# Patient Record
Sex: Female | Born: 1965
Health system: Southern US, Community
[De-identification: ages and names within clinical notes are randomized; demographics above are authoritative.]

## PROBLEM LIST (undated history)

## (undated) DIAGNOSIS — Z91199 Patient's noncompliance with other medical treatment and regimen due to unspecified reason: Secondary | ICD-10-CM

## (undated) DIAGNOSIS — F3113 Bipolar disorder, current episode manic without psychotic features, severe: Secondary | ICD-10-CM

## (undated) DIAGNOSIS — I10 Essential (primary) hypertension: Secondary | ICD-10-CM

## (undated) DIAGNOSIS — Z9119 Patient's noncompliance with other medical treatment and regimen: Secondary | ICD-10-CM

## (undated) DIAGNOSIS — E119 Type 2 diabetes mellitus without complications: Secondary | ICD-10-CM

## (undated) DIAGNOSIS — E785 Hyperlipidemia, unspecified: Secondary | ICD-10-CM

## (undated) HISTORY — DX: Type 2 diabetes mellitus without complications: E11.9

## (undated) HISTORY — DX: Patient's noncompliance with other medical treatment and regimen: Z91.19

## (undated) HISTORY — DX: Patient's noncompliance with other medical treatment and regimen due to unspecified reason: Z91.199

## (undated) HISTORY — PX: NO PAST SURGERIES: SHX2092

## (undated) HISTORY — DX: Essential (primary) hypertension: I10

## (undated) HISTORY — DX: Bipolar disorder, current episode manic without psychotic features, severe: F31.13

## (undated) HISTORY — DX: Hyperlipidemia, unspecified: E78.5

---

## 2005-10-13 ENCOUNTER — Ambulatory Visit: Payer: Self-pay | Admitting: Family Medicine

## 2005-10-22 ENCOUNTER — Ambulatory Visit: Payer: Self-pay | Admitting: *Deleted

## 2006-05-07 ENCOUNTER — Emergency Department (HOSPITAL_COMMUNITY): Admission: EM | Admit: 2006-05-07 | Discharge: 2006-05-08 | Payer: Self-pay | Admitting: Emergency Medicine

## 2006-05-12 ENCOUNTER — Ambulatory Visit: Payer: Self-pay | Admitting: Family Medicine

## 2006-05-24 ENCOUNTER — Ambulatory Visit: Payer: Self-pay | Admitting: Family Medicine

## 2006-05-27 ENCOUNTER — Ambulatory Visit (HOSPITAL_COMMUNITY): Admission: RE | Admit: 2006-05-27 | Discharge: 2006-05-27 | Payer: Self-pay | Admitting: Family Medicine

## 2006-06-02 ENCOUNTER — Ambulatory Visit: Payer: Self-pay | Admitting: Family Medicine

## 2006-06-02 LAB — CONVERTED CEMR LAB

## 2007-08-29 ENCOUNTER — Encounter (INDEPENDENT_AMBULATORY_CARE_PROVIDER_SITE_OTHER): Payer: Self-pay | Admitting: Family Medicine

## 2007-08-29 DIAGNOSIS — J309 Allergic rhinitis, unspecified: Secondary | ICD-10-CM | POA: Insufficient documentation

## 2019-02-20 ENCOUNTER — Ambulatory Visit: Payer: Self-pay | Admitting: Nurse Practitioner

## 2020-06-14 ENCOUNTER — Other Ambulatory Visit: Payer: Self-pay

## 2020-06-14 ENCOUNTER — Ambulatory Visit (INDEPENDENT_AMBULATORY_CARE_PROVIDER_SITE_OTHER): Payer: No Payment, Other | Admitting: Psychiatry

## 2020-06-14 ENCOUNTER — Encounter (HOSPITAL_COMMUNITY): Payer: Self-pay | Admitting: Psychiatry

## 2020-06-14 DIAGNOSIS — F319 Bipolar disorder, unspecified: Secondary | ICD-10-CM | POA: Diagnosis not present
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
## 2020-07-11 ENCOUNTER — Ambulatory Visit (HOSPITAL_COMMUNITY): Payer: Self-pay | Admitting: Psychiatry

## 2020-07-23 ENCOUNTER — Ambulatory Visit: Payer: Self-pay | Attending: Nurse Practitioner | Admitting: Nurse Practitioner

## 2020-07-23 ENCOUNTER — Encounter: Payer: Self-pay | Admitting: Nurse Practitioner

## 2020-07-23 VITALS — Ht 76.0 in | Wt 184.0 lb

## 2020-07-23 DIAGNOSIS — E1165 Type 2 diabetes mellitus with hyperglycemia: Secondary | ICD-10-CM

## 2020-07-23 DIAGNOSIS — E118 Type 2 diabetes mellitus with unspecified complications: Secondary | ICD-10-CM

## 2020-07-23 DIAGNOSIS — IMO0002 Reserved for concepts with insufficient information to code with codable children: Secondary | ICD-10-CM

## 2020-07-23 DIAGNOSIS — I10 Essential (primary) hypertension: Secondary | ICD-10-CM

## 2020-07-23 DIAGNOSIS — E785 Hyperlipidemia, unspecified: Secondary | ICD-10-CM

## 2020-07-23 DIAGNOSIS — Z114 Encounter for screening for human immunodeficiency virus [HIV]: Secondary | ICD-10-CM

## 2020-07-23 DIAGNOSIS — Z1159 Encounter for screening for other viral diseases: Secondary | ICD-10-CM

## 2020-07-23 DIAGNOSIS — Z1211 Encounter for screening for malignant neoplasm of colon: Secondary | ICD-10-CM

## 2020-07-23 DIAGNOSIS — Z130 Encounter for screening for diseases of the blood and blood-forming organs and certain disorders involving the immune mechanism: Secondary | ICD-10-CM

## 2020-07-23 NOTE — Progress Notes (Signed)
Virtual Visit via Telephone Note Due to national recommendations of social distancing due to [place] 19, telehealth visit is felt to be most appropriate for this patient at this time.  I discussed the limitations, risks, security and privacy concerns of performing an evaluation and management service by telephone and the availability of in person appointments. I also discussed with the patient that there may be a patient responsible charge related to this service. The patient expressed understanding and agreed to proceed.    I connected with Sherron Flemings on 07/23/20  at   8:30 AM EDT  EDT by telephone and verified that I am speaking with the correct person using two identifiers.   Consent I discussed the limitations, risks, security and privacy concerns of performing an evaluation and management service by telephone and the availability of in person appointments. I also discussed with the patient that there may be a patient responsible charge related to this service. The patient expressed understanding and agreed to proceed.   Location of Patient: Private Residence   Location of Provider: [place] and [employer] Office    Persons participating in Telemedicine visit: Geryl Rankins FNP-BC [place]    History of Present Illness: Telemedicine visit for: Establish care  has a past medical history of Bipolar disorder with severe mania ([place]), Diabetes mellitus without complication ([place]), Hyperlipidemia, Hypertension, and Noncompliance.  States she does not have HTN or DM. I have instructed her that my goal if she sees me will be to treat her chronic health conditions and that may require medications to be prescribed. If she does not want to take medications she would need to seek a holistic practitioner as I can not treat an A1c >15 or malignant HTN without medications.   DM TYPE 2 Last A1c 04-2020 was >15. She was prescribed metformin but is not taking it. Has a history  of non adherence. States she does not have diabetes and uses her mother and father's meter. When I asked her to recall some of her meter readings she states her last blood glucose reading was 120.70. I instructed her to bring her meter with her to her appointment as this is not an actual blood glucose readings by our office standards.   Essential Hypertension Last recorded BP reading 180/115 on 06-10-2020 when she attempted to establish care at another Primary office through [facility]. At that time she refused to start any blood pressure medications and stated she was drinking a special tea to help bring her blood pressure down. Today she states her blood pressure is "fine". Recalls readings at home 180-190/100-110s. I have instructed her that these are not normal blood pressure readings however she states they are. Denies chest pain, shortness of breath, palpitations, lightheadedness, dizziness, headaches or BLE edema.   Seeing a counselor at [facility] behavioral health for her Bipolar 1 disorder. She is noncompliant on Vraylar 3 mg daily, Depakote 500 mg a.m. and Depakote 1000 mg at bedtime.   Past Medical History:  Diagnosis Date  . Bipolar disorder with severe mania ([place])   . Diabetes mellitus without complication ([place])   . Hyperlipidemia   . Hypertension   . Noncompliance     Past Surgical History:  Procedure Laterality Date  . NO PAST SURGERIES      Family History  Problem Relation Age of Onset  . Diabetes Mother   . Hypertension Mother   . Diabetes Father   . Hypertension Father     Social History  Socioeconomic History  . Marital status: Single    Spouse name: Not on file  . Number of children: Not on file  . Years of education: Not on file  . Highest education level: Not on file  Occupational History  . Not on file  Tobacco Use  . Smoking status: Never Smoker  . Smokeless tobacco: Never Used  Substance and Sexual Activity  . Alcohol use: Not Currently  . Drug use: Not Currently   . Sexual activity: Not Currently  Other Topics Concern  . Not on file  Social History Narrative  . Not on file   Social Determinants of Health   Financial Resource Strain:   . Difficulty of Paying Living Expenses: Not on file  Food Insecurity:   . Worried About Charity fundraiser in the Last Year: Not on file  . Ran Out of Food in the Last Year: Not on file  Transportation Needs:   . Lack of Transportation (Medical): Not on file  . Lack of Transportation (Non-Medical): Not on file  Physical Activity:   . Days of Exercise per Week: Not on file  . Minutes of Exercise per Session: Not on file  Stress:   . Feeling of Stress : Not on file  Social Connections:   . Frequency of Communication with Friends and Family: Not on file  . Frequency of Social Gatherings with Friends and Family: Not on file  . Attends Religious Services: Not on file  . Active Member of Clubs or Organizations: Not on file  . Attends Archivist Meetings: Not on file  . Marital Status: Not on file     Observations/Objective: Awake, alert and oriented x 3   Review of Systems  Constitutional: Negative for fever, malaise/fatigue and weight loss.  HENT: Negative.  Negative for nosebleeds.   Eyes: Negative.  Negative for blurred vision, double vision and photophobia.  Respiratory: Negative.  Negative for cough and shortness of breath.   Cardiovascular: Negative.  Negative for chest pain, palpitations and leg swelling.  Gastrointestinal: Negative.  Negative for heartburn, nausea and vomiting.  Musculoskeletal: Negative.  Negative for myalgias.  Neurological: Negative.  Negative for dizziness, focal weakness, seizures and headaches.  Psychiatric/Behavioral: Negative for suicidal ideas.    Assessment and Plan: Daneisha was seen today for new patient (initial visit).  Diagnoses and all orders for this visit:  Diabetes mellitus type 2, uncontrolled, with complications ([place]) -     Hemoglobin A1c;  Future -     Microalbumin / creatinine urine ratio; Future  Dyslipidemia, goal LDL below 70 -     Lipid panel; Future  Essential hypertension -     CMP14+EGFR; Future  Screening for deficiency anemia -     CBC; Future  Colon cancer screening -     Fecal occult blood, imunochemical([facility]); Future  Need for hepatitis C screening test -     Hepatitis C Antibody; Future  Encounter for screening for HIV -     HIV antibody (with reflex); Future     Follow Up Instructions Return in about 6 weeks (around 09/03/2020).     I discussed the assessment and treatment plan with the patient. The patient was provided an opportunity to ask questions and all were answered. The patient agreed with the plan and demonstrated an understanding of the instructions.   The patient was advised to call back or seek an in-person evaluation if the symptoms worsen or if the condition fails to improve as anticipated.  I provided 19 minutes of non-face-to-face time during this encounter including median intraservice time, reviewing previous notes, labs, imaging, medications and explaining diagnosis and management.  Gildardo Pounds, FNP-BC

## 2020-08-05 ENCOUNTER — Ambulatory Visit: Payer: Self-pay | Attending: Nurse Practitioner

## 2020-08-05 ENCOUNTER — Other Ambulatory Visit: Payer: Self-pay

## 2020-08-05 DIAGNOSIS — Z1211 Encounter for screening for malignant neoplasm of colon: Secondary | ICD-10-CM

## 2020-08-05 DIAGNOSIS — IMO0002 Reserved for concepts with insufficient information to code with codable children: Secondary | ICD-10-CM

## 2020-08-05 DIAGNOSIS — Z1159 Encounter for screening for other viral diseases: Secondary | ICD-10-CM

## 2020-08-05 DIAGNOSIS — Z114 Encounter for screening for human immunodeficiency virus [HIV]: Secondary | ICD-10-CM

## 2020-08-05 DIAGNOSIS — Z130 Encounter for screening for diseases of the blood and blood-forming organs and certain disorders involving the immune mechanism: Secondary | ICD-10-CM

## 2020-08-05 DIAGNOSIS — I10 Essential (primary) hypertension: Secondary | ICD-10-CM

## 2020-08-05 DIAGNOSIS — E785 Hyperlipidemia, unspecified: Secondary | ICD-10-CM

## 2020-08-06 ENCOUNTER — Other Ambulatory Visit: Payer: Self-pay | Admitting: Nurse Practitioner

## 2020-08-06 LAB — CMP14+EGFR
ALT: 9 IU/L (ref 0–32)
AST: 13 IU/L (ref 0–40)
Albumin/Globulin Ratio: 1.4 (ref 1.2–2.2)
Albumin: 3.9 g/dL (ref 3.8–4.9)
Alkaline Phosphatase: 83 IU/L (ref 44–121)
BUN/Creatinine Ratio: 15 (ref 9–23)
BUN: 11 mg/dL (ref 6–24)
Bilirubin Total: <0.2 mg/dL (ref 0.0–1.2)
CO2: 21 mmol/L (ref 20–29)
Calcium: 9.1 mg/dL (ref 8.7–10.2)
Chloride: 98 mmol/L (ref 96–106)
Creatinine, Ser: 0.71 mg/dL (ref 0.57–1.00)
GFR calc Af Amer: 112 mL/min/{1.73_m2} (ref >59)
GFR calc non Af Amer: 98 mL/min/{1.73_m2} (ref >59)
Globulin, Total: 2.8 g/dL (ref 1.5–4.5)
Glucose: 497 mg/dL — ABNORMAL HIGH (ref 65–99)
Potassium: 4.3 mmol/L (ref 3.5–5.2)
Sodium: 134 mmol/L (ref 134–144)
Total Protein: 6.7 g/dL (ref 6.0–8.5)

## 2020-08-06 LAB — HEPATITIS C ANTIBODY: Hep C Virus Ab: <0.1 s/co ratio (ref 0.0–0.9)

## 2020-08-06 LAB — MICROALBUMIN / CREATININE URINE RATIO
Creatinine, Urine: 27.9 mg/dL
Microalb/Creat Ratio: 137 mg/g creat — ABNORMAL HIGH (ref 0–29)
Microalbumin, Urine: 38.1 ug/mL

## 2020-08-06 LAB — CBC
Hematocrit: 43.3 % (ref 34.0–46.6)
Hemoglobin: 13 g/dL (ref 11.1–15.9)
MCH: 24.3 pg — ABNORMAL LOW (ref 26.6–33.0)
MCHC: 30 g/dL — ABNORMAL LOW (ref 31.5–35.7)
MCV: 81 fL (ref 79–97)
Platelets: 273 10*3/uL (ref 150–450)
RBC: 5.34 x10E6/uL — ABNORMAL HIGH (ref 3.77–5.28)
RDW: 14.3 % (ref 11.7–15.4)
WBC: 9.8 10*3/uL (ref 3.4–10.8)

## 2020-08-06 LAB — LIPID PANEL
Chol/HDL Ratio: 3.4 ratio (ref 0.0–4.4)
Cholesterol, Total: 233 mg/dL — ABNORMAL HIGH (ref 100–199)
HDL: 68 mg/dL (ref >39)
LDL Chol Calc (NIH): 109 mg/dL — ABNORMAL HIGH (ref 0–99)
Triglycerides: 332 mg/dL — ABNORMAL HIGH (ref 0–149)
VLDL Cholesterol Cal: 56 mg/dL — ABNORMAL HIGH (ref 5–40)

## 2020-08-06 LAB — HIV ANTIBODY (ROUTINE TESTING W REFLEX): HIV Screen 4th Generation wRfx: NONREACTIVE

## 2020-08-06 LAB — HEMOGLOBIN A1C
Est. average glucose Bld gHb Est-mCnc: >398 mg/dL
Hgb A1c MFr Bld: >15.5 % — ABNORMAL HIGH (ref 4.8–5.6)

## 2020-08-06 MED ORDER — METFORMIN HCL 500 MG PO TABS: 500.0000 mg | ORAL_TABLET | Freq: Two times a day (BID) | ORAL | 3 refills | Status: DC

## 2020-08-06 MED ORDER — TRUE METRIX METER W/DEVICE KIT: PACK | 0 refills | Status: DC

## 2020-08-06 MED ORDER — VICTOZA 18 MG/3ML ~~LOC~~ SOPN: PEN_INJECTOR | SUBCUTANEOUS | 3 refills | Status: DC

## 2020-08-06 MED ORDER — ATORVASTATIN CALCIUM 20 MG PO TABS: 20.0000 mg | ORAL_TABLET | Freq: Every day | ORAL | 3 refills | Status: DC

## 2020-08-06 MED ORDER — TRUE METRIX BLOOD GLUCOSE TEST VI STRP: ORAL_STRIP | 12 refills | Status: DC

## 2020-08-06 MED ORDER — TRUEPLUS LANCETS 28G MISC: 3 refills | Status: DC

## 2020-08-06 MED ORDER — BD PEN NEEDLE MINI U/F 31G X 5 MM MISC: 1 refills | Status: DC

## 2020-08-07 MED FILL — ATORVASTATIN CALCIUM 20 MG: 20 | 30 days supply | Qty: 30 | Fill #0

## 2020-08-07 MED FILL — TRUEPLUS PEN NDL 31GX3/16: 31G X 5 MM | 90 days supply | Qty: 100 | Fill #0

## 2020-08-07 MED FILL — !VICTOZA 18MG/3ML INJECT: 18 | 27 days supply | Qty: 9 | Fill #0

## 2020-08-07 MED FILL — METFORMIN HCL 500 MG TABS: 500 | 22 days supply | Qty: 60 | Fill #0

## 2020-08-07 MED FILL — !TRUE METRIX BLOOD GLUCOSE: 30 days supply | Qty: 1 | Fill #0

## 2020-08-07 MED FILL — TRUEplus LANCETS 28G MISC: 34 days supply | Qty: 100 | Fill #0

## 2020-08-07 MED FILL — TRUE METRIX TEST STRIP: 34 days supply | Qty: 100 | Fill #0

## 2020-08-11 LAB — FECAL OCCULT BLOOD, IMMUNOCHEMICAL: Fecal Occult Bld: NEGATIVE

## 2020-08-19 ENCOUNTER — Encounter (HOSPITAL_COMMUNITY): Payer: Self-pay | Admitting: Psychiatry

## 2020-08-19 ENCOUNTER — Other Ambulatory Visit: Payer: Self-pay

## 2020-08-19 ENCOUNTER — Ambulatory Visit (INDEPENDENT_AMBULATORY_CARE_PROVIDER_SITE_OTHER): Payer: No Payment, Other | Admitting: Psychiatry

## 2020-08-19 VITALS — BP 199/96 | HR 96 | Ht 66.0 in | Wt 186.0 lb

## 2020-08-19 DIAGNOSIS — F319 Bipolar disorder, unspecified: Secondary | ICD-10-CM

## 2020-08-19 NOTE — Progress Notes (Signed)
BH MD/PA/NP OP Progress Note  08/19/2020 4:53 PM Rebecca Atkins  MRN:  [id number]  Chief Complaint: "I just like to talk"       Per father "when she was on her medications she talked less"  Chief Complaint    Follow-up      HPI: 54 year old female seen today for follow up psychiatric evaluation.    She has a psychiatric history of bipolar 1 disorder.  She is currently not taken any medications and notes that she is doing well.  On exam patient is pleasant, calm, cooperative, engaged in conversation, and maintains eye contact. She notes that she has been doing well without medications and notes that she would not like to restart any medications. She notes that she tried medications in the past for her family members however now would like to do things her way. She informed Probation officer that she talks a lot which is bothersome to her family. She notes that she talks to her self daily and notes she speaks what she is thinking frequently. She denies VAH/SI/HI or paranoia. Patient was seen with her father who notes that the patient talks too much about her past in which she is thinking. He notes that when she was on her medication she talks less. He denies patient having impulsive behaviors, distractibility, irritability, or racing thoughts, grandiosity, or self injures behaviors. He however notes that she utilizes a lot of water which he notes is bothersome to the family and in the past he notes that she would clean her pills. Patient notes that she enjoys drinking water and notes that she drinks a cup of water every 30 minutes. She notes in the past when she would drop the pill she would wash it off. She denies obsessive behaviors.   Patients vitals were taken toay by nursing staff today. Her initial blood pressure was 219/98. It was taken a second time and the reading was 199/96. Patient notes that she has high cholesterol and hypertension however notes that she does not take medications for these diagnoses. She  informed Probation officer that she has an appointment in a month with her primary care provider. Provider encouraged patient to follow-up with her primary care provider soon to help manage her hypertension. Provider discussed potential effects that could occur with increased blood pressure. She endorsed understanding and agreed.  No medications restarted today. Patient will follow up with PCP to manage her hypertension. She will follow-up with outpatient psychiatry as needed for care. No other concerns noted at this time.  Visit Diagnosis:    ICD-10-CM   1. Bipolar I disorder, in full remission ([place])  F31.9     Past Psychiatric History:  bipolar 1 disorder  Past Medical History:  Past Medical History:  Diagnosis Date  . Bipolar disorder with severe mania ([place])   . Diabetes mellitus without complication ([place])   . Hyperlipidemia   . Hypertension   . Noncompliance     Past Surgical History:  Procedure Laterality Date  . NO PAST SURGERIES      Family Psychiatric History: Denies  Family History:  Family History  Problem Relation Age of Onset  . Diabetes Mother   . Hypertension Mother   . Diabetes Father   . Hypertension Father     Social History:  Social History   Socioeconomic History  . Marital status: Single    Spouse name: Not on file  . Number of children: Not on file  . Years of education: Not on file  .  Highest education level: Not on file  Occupational History  . Not on file  Tobacco Use  . Smoking status: Never Smoker  . Smokeless tobacco: Never Used  Substance and Sexual Activity  . Alcohol use: Not Currently  . Drug use: Not Currently  . Sexual activity: Not Currently  Other Topics Concern  . Not on file  Social History Narrative  . Not on file   Social Determinants of Health   Financial Resource Strain:   . Difficulty of Paying Living Expenses: Not on file  Food Insecurity:   . Worried About Charity fundraiser in the Last Year: Not on file  . Ran Out of  Food in the Last Year: Not on file  Transportation Needs:   . Lack of Transportation (Medical): Not on file  . Lack of Transportation (Non-Medical): Not on file  Physical Activity:   . Days of Exercise per Week: Not on file  . Minutes of Exercise per Session: Not on file  Stress:   . Feeling of Stress : Not on file  Social Connections:   . Frequency of Communication with Friends and Family: Not on file  . Frequency of Social Gatherings with Friends and Family: Not on file  . Attends Religious Services: Not on file  . Active Member of Clubs or Organizations: Not on file  . Attends Archivist Meetings: Not on file  . Marital Status: Not on file    Allergies:  Allergies  Allergen Reactions  . Ibuprofen Rash    Metabolic Disorder Labs: Lab Results  Component Value Date   HGBA1C >15.5 (H) 08/05/2020   No results found for: PROLACTIN Lab Results  Component Value Date   CHOL 233 (H) 08/05/2020   TRIG 332 (H) 08/05/2020   HDL 68 08/05/2020   CHOLHDL 3.4 08/05/2020   LDLCALC 109 (H) 08/05/2020   No results found for: TSH  Therapeutic Level Labs: No results found for: LITHIUM No results found for: VALPROATE No components found for:  CBMZ  Current Medications: Current Outpatient Medications  Medication Sig Dispense Refill  . ASPIRIN 81 PO Take by mouth. (Patient not taking: Reported on 08/19/2020)    . atorvastatin (LIPITOR) 20 MG tablet Take 1 tablet (20 mg total) by mouth daily. (Patient not taking: Reported on 08/19/2020) 90 tablet 3  . Blood Glucose Monitoring Suppl (TRUE METRIX METER) w/Device KIT Use as instructed. Check blood glucose level by fingerstick three times per day. (Patient not taking: Reported on 08/19/2020) 1 kit 0  . CALCIUM PO Take by mouth. (Patient not taking: Reported on 08/19/2020)    . glucose blood (TRUE METRIX BLOOD GLUCOSE TEST) test strip Use as instructed. Check blood glucose level by fingerstick three times per day. (Patient not taking:  Reported on 08/19/2020) 100 each 12  . Insulin Pen Needle (B-D UF III MINI PEN NEEDLES) 31G X 5 MM MISC Use as instructed. Inject into the skin once daily (Patient not taking: Reported on 08/19/2020) 100 each 1  . liraglutide (VICTOZA) 18 MG/3ML SOPN SubQ:  Inject 0.6 mg once daily into the skin. Week 2: increase to 1.2 mg once daily; week 3: increase to 1.8 mg once daily (Patient not taking: Reported on 08/19/2020) 9 mL 3  . metFORMIN (GLUCOPHAGE) 500 MG tablet Take 1 tablet (500 mg total) by mouth 2 (two) times daily with a meal. Increase to 2 tablets twice per day after 2 weeks if able to tolerate. (Patient not taking: Reported on 08/19/2020) 180  tablet 3  . TRUEplus Lancets 28G MISC Use as instructed. Check blood glucose level by fingerstick three times per day. (Patient not taking: Reported on 08/19/2020) 100 each 3   No current facility-administered medications for this visit.     Musculoskeletal: Strength & Muscle Tone: within normal limits Gait & Station: normal Patient leans: N/A  Psychiatric Specialty Exam: Review of Systems  Blood pressure (!) 199/96, pulse 96, height 5' 6"  (1.676 m), weight 186 lb (84.4 kg), SpO2 100 %.Body mass index is 30.02 kg/m.  General Appearance: Well Groomed  Eye Contact:  Good  Speech:  Clear and Coherent and Normal Rate  Volume:  Normal  Mood:  Euthymic  Affect:  Appropriate and Congruent  Thought Process:  Coherent, Goal Directed and Linear  Orientation:  Full (Time, Place, and Person)  Thought Content: WDL and Logical   Suicidal Thoughts:  No  Homicidal Thoughts:  No  Memory:  Immediate;   Good Recent;   Good Remote;   Good  Judgement:  Good  Insight:  Good  Psychomotor Activity:  Normal  Concentration:  Concentration: Good and Attention Span: Good  Recall:  Good  Fund of Knowledge: Good  Language: Good  Akathisia:  No  Handed:  Right  AIMS (if indicated): Not done  Assets:  Communication Skills Desire for Improvement Financial  Resources/Insurance Housing Social Support  ADL's:  Intact  Cognition: WNL  Sleep:  Good   Screenings:   Assessment and Plan: Patient currently is not prescribed psychiatric medications. She notes that she is doing well and will follow up as needed.  Follow up as needed.    Salley Slaughter, NP 08/19/2020, 4:52 PM

## 2020-08-21 ENCOUNTER — Ambulatory Visit: Payer: Self-pay | Attending: Nurse Practitioner

## 2020-08-21 ENCOUNTER — Other Ambulatory Visit: Payer: Self-pay

## 2020-09-02 ENCOUNTER — Other Ambulatory Visit: Payer: Self-pay | Admitting: Nurse Practitioner

## 2020-09-02 ENCOUNTER — Encounter: Payer: Self-pay | Admitting: Nurse Practitioner

## 2020-09-02 ENCOUNTER — Other Ambulatory Visit: Payer: Self-pay

## 2020-09-02 ENCOUNTER — Ambulatory Visit: Payer: Self-pay | Attending: Nurse Practitioner | Admitting: Nurse Practitioner

## 2020-09-02 VITALS — BP 162/95 | HR 74 | Temp 97.7°F | Ht 65.0 in | Wt 185.0 lb

## 2020-09-02 DIAGNOSIS — Z0000 Encounter for general adult medical examination without abnormal findings: Secondary | ICD-10-CM

## 2020-09-02 DIAGNOSIS — E785 Hyperlipidemia, unspecified: Secondary | ICD-10-CM

## 2020-09-02 DIAGNOSIS — IMO0002 Reserved for concepts with insufficient information to code with codable children: Secondary | ICD-10-CM

## 2020-09-02 DIAGNOSIS — E118 Type 2 diabetes mellitus with unspecified complications: Secondary | ICD-10-CM

## 2020-09-02 DIAGNOSIS — Z1211 Encounter for screening for malignant neoplasm of colon: Secondary | ICD-10-CM

## 2020-09-02 DIAGNOSIS — E1165 Type 2 diabetes mellitus with hyperglycemia: Secondary | ICD-10-CM

## 2020-09-02 DIAGNOSIS — I10 Essential (primary) hypertension: Secondary | ICD-10-CM

## 2020-09-02 DIAGNOSIS — Z1231 Encounter for screening mammogram for malignant neoplasm of breast: Secondary | ICD-10-CM

## 2020-09-02 LAB — GLUCOSE, POCT (MANUAL RESULT ENTRY): POC Glucose: 366 mg/dl — AB (ref 70–99)

## 2020-09-02 MED ORDER — TRUE METRIX BLOOD GLUCOSE TEST VI STRP: ORAL_STRIP | 12 refills | Status: DC

## 2020-09-02 MED ORDER — TRUEPLUS LANCETS 28G MISC: 3 refills | Status: DC

## 2020-09-02 MED ORDER — TRUE METRIX METER W/DEVICE KIT: PACK | 0 refills | Status: DC

## 2020-09-02 MED ORDER — METFORMIN HCL 500 MG PO TABS: 500.0000 mg | ORAL_TABLET | Freq: Two times a day (BID) | ORAL | 3 refills | Status: DC

## 2020-09-02 MED ORDER — LOSARTAN POTASSIUM 50 MG PO TABS: 50.0000 mg | ORAL_TABLET | Freq: Every day | ORAL | 3 refills | Status: DC

## 2020-09-02 MED ORDER — VICTOZA 18 MG/3ML ~~LOC~~ SOPN: PEN_INJECTOR | SUBCUTANEOUS | 3 refills | Status: DC

## 2020-09-02 MED ORDER — BD PEN NEEDLE MINI U/F 31G X 5 MM MISC: 1 refills | Status: DC

## 2020-09-02 MED ORDER — ATORVASTATIN CALCIUM 20 MG PO TABS: 20.0000 mg | ORAL_TABLET | Freq: Every day | ORAL | 3 refills | Status: DC

## 2020-09-02 MED FILL — TRUEplus LANCETS 28G MISC: 25 days supply | Qty: 100 | Fill #0

## 2020-09-02 MED FILL — ATORVASTATIN CALCIUM 20 MG: 20 | 30 days supply | Qty: 30 | Fill #0

## 2020-09-02 MED FILL — TRUE METRIX GO GLUCOSE METE: W/DEVICE | 30 days supply | Qty: 1 | Fill #0

## 2020-09-02 MED FILL — TRUEPLUS PEN NDL 31GX3/16: 31G X 5 MM | 25 days supply | Qty: 100 | Fill #0

## 2020-09-02 MED FILL — !VICTOZA 18MG/3ML INJECT: 18 | 17 days supply | Qty: 3 | Fill #0

## 2020-09-02 MED FILL — METFORMIN HCL 500 MG TABS: 500 | 30 days supply | Qty: 120 | Fill #0

## 2020-09-02 MED FILL — TRUE METRIX TEST STRIP: 30 days supply | Qty: 100 | Fill #0

## 2020-09-02 NOTE — Progress Notes (Signed)
Assessment & Plan:  [name] was seen today for annual exam.  Diagnoses and all orders for this visit:  Physical exam  Colon cancer screening -     Cancel: Fecal occult blood, imunochemical([facility])  Diabetes mellitus type 2, uncontrolled, with complications (HCC) -     Glucose (CBG) -     Blood Glucose Monitoring Suppl (TRUE METRIX METER) w/Device KIT; Use as instructed. Check blood glucose level by fingerstick three times per day. (Patient not taking: Reported on 09/02/2020) -     glucose blood (TRUE METRIX BLOOD GLUCOSE TEST) test strip; Use as instructed. Check blood glucose level by fingerstick three times per day. (Patient not taking: Reported on 09/02/2020) -     Insulin Pen Needle (B-D UF III MINI PEN NEEDLES) 31G X 5 MM MISC; Use as instructed. Inject into the skin once daily. NEEDS PASS (Patient not taking: Reported on 09/02/2020) -     liraglutide (VICTOZA) 18 MG/3ML SOPN; SubQ:  Inject 0.6 mg once daily into the skin. Week 2: increase to 1.2 mg once daily; week 3: increase to 1.8 mg once daily NEEDS PASS (Patient not taking: Reported on 09/02/2020) -     metFORMIN (GLUCOPHAGE) 500 MG tablet; Take 1 tablet (500 mg total) by mouth 2 (two) times daily with a meal. Increase to 2 tablets twice per day after 2 weeks if able to tolerate. NEEDS PASS (Patient not taking: Reported on 09/02/2020) -     TRUEplus Lancets 28G MISC; Use as instructed. Check blood glucose level by fingerstick three times per day. (Patient not taking: Reported on 09/02/2020) -     losartan (COZAAR) 50 MG tablet; Take 1 tablet (50 mg total) by mouth daily. Continue blood sugar control as discussed in office today, low carbohydrate diet, and regular physical exercise as tolerated, 150 minutes per week (30 min each day, 5 days per week, or 50 min 3 days per week). Keep blood sugar logs with fasting goal of 90-130 mg/dl, post prandial (after you eat) less than 180.  For Hypoglycemia: BS <60 and Hyperglycemia BS  >400; contact the clinic ASAP. Annual eye exams and foot exams are recommended.   Breast cancer screening by mammogram -     MM 3D SCREEN BREAST BILATERAL; Future  Dyslipidemia, goal LDL below 70 -     atorvastatin (LIPITOR) 20 MG tablet; Take 1 tablet (20 mg total) by mouth daily. NEEDS PASS (Patient not taking: Reported on 09/02/2020) INSTRUCTIONS: Work on a low fat, heart healthy diet and participate in regular aerobic exercise program by working out at least 150 minutes per week; 5 days a week-30 minutes per day. Avoid red meat/beef/steak,  fried foods. junk foods, sodas, sugary drinks, unhealthy snacking, alcohol and smoking.  Drink at least 80 oz of water per day and monitor your carbohydrate intake daily.    Essential hypertension -     losartan (COZAAR) 50 MG tablet; Take 1 tablet (50 mg total) by mouth daily. Continue all antihypertensives as prescribed.  Remember to bring in your blood pressure log with you for your follow up appointment.  DASH/Mediterranean Diets are healthier choices for HTN.      Patient has been counseled on age-appropriate routine health concerns for screening and prevention. These are reviewed and up-to-date. Referrals have been placed accordingly. Immunizations are up-to-date or declined.    Subjective:   Chief Complaint  Patient presents with  . Annual Exam    Pt. is here for a physical.    HPI  Rebecca Atkins 54 y.o. female presents to office today for physical exam. She adamantly declines PAP smear. Will refer to breast clinic today for mammogram scholarship program.    DM TYPE 2 She has denied having diabetes in the past and up until today has not picked up any of her diabetes medications. Today she states the reason she has not picked up her medications is because she can not afford them. She also tells me today that she does not have a previous history of diabetes. Although A1c in June was 13.2. Associated symptoms: Urinary frequency. SHE  DECLINED INSULIN TODAY IN OFFICE BG 366 Lab Results  Component Value Date   HGBA1C >15.5 (H) 08/05/2020   Review of Systems  Constitutional: Negative for fever, malaise/fatigue and weight loss.  HENT: Negative.  Negative for nosebleeds.   Eyes: Negative.  Negative for blurred vision, double vision and photophobia.  Respiratory: Negative.  Negative for cough and shortness of breath.   Cardiovascular: Negative.  Negative for chest pain, palpitations and leg swelling.  Gastrointestinal: Negative.  Negative for heartburn, nausea and vomiting.  Genitourinary: Positive for frequency.  Musculoskeletal: Negative.  Negative for myalgias.  Skin: Negative.   Neurological: Positive for headaches. Negative for dizziness, focal weakness and seizures.  Endo/Heme/Allergies: Negative.   Psychiatric/Behavioral: Negative.  Negative for suicidal ideas.    Past Medical History:  Diagnosis Date  . Bipolar disorder with severe mania ([place])   . Diabetes mellitus without complication ([place])   . Hyperlipidemia   . Hypertension   . Noncompliance     Past Surgical History:  Procedure Laterality Date  . NO PAST SURGERIES      Family History  Problem Relation Age of Onset  . Diabetes Mother   . Hypertension Mother   . Diabetes Father   . Hypertension Father     Social History Reviewed with no changes to be made today.   Outpatient Medications Prior to Visit  Medication Sig Dispense Refill  . ASPIRIN 81 PO Take by mouth.     Marland Kitchen CALCIUM PO Take by mouth.     Marland Kitchen atorvastatin (LIPITOR) 20 MG tablet Take 1 tablet (20 mg total) by mouth daily. (Patient not taking: Reported on 08/19/2020) 90 tablet 3  . Blood Glucose Monitoring Suppl (TRUE METRIX METER) w/Device KIT Use as instructed. Check blood glucose level by fingerstick three times per day. (Patient not taking: Reported on 08/19/2020) 1 kit 0  . glucose blood (TRUE METRIX BLOOD GLUCOSE TEST) test strip Use as instructed. Check blood glucose level by  fingerstick three times per day. (Patient not taking: Reported on 08/19/2020) 100 each 12  . Insulin Pen Needle (B-D UF III MINI PEN NEEDLES) 31G X 5 MM MISC Use as instructed. Inject into the skin once daily (Patient not taking: Reported on 08/19/2020) 100 each 1  . liraglutide (VICTOZA) 18 MG/3ML SOPN SubQ:  Inject 0.6 mg once daily into the skin. Week 2: increase to 1.2 mg once daily; week 3: increase to 1.8 mg once daily (Patient not taking: Reported on 08/19/2020) 9 mL 3  . metFORMIN (GLUCOPHAGE) 500 MG tablet Take 1 tablet (500 mg total) by mouth 2 (two) times daily with a meal. Increase to 2 tablets twice per day after 2 weeks if able to tolerate. (Patient not taking: Reported on 08/19/2020) 180 tablet 3  . TRUEplus Lancets 28G MISC Use as instructed. Check blood glucose level by fingerstick three times per day. (Patient not taking: Reported on 08/19/2020) 100 each  3   No facility-administered medications prior to visit.    Allergies  Allergen Reactions  . Ibuprofen Rash       Objective:    BP (!) 162/95 (BP Location: Left Arm, Patient Position: Sitting, Cuff Size: Normal)   Pulse 74   Temp 97.7 F (36.5 C) (Temporal)   Ht 5' 5"  (1.651 m)   Wt 185 lb (83.9 kg)   LMP 12/04/2019   SpO2 99%   BMI 30.79 kg/m  Wt Readings from Last 3 Encounters:  09/02/20 185 lb (83.9 kg)  07/23/20 184 lb (83.5 kg)    Physical Exam Constitutional:      Appearance: She is well-developed.  HENT:     Head: Normocephalic and atraumatic.     Right Ear: Hearing, tympanic membrane, ear canal and external ear normal.     Left Ear: Hearing, tympanic membrane, ear canal and external ear normal.     Nose: Nose normal.     Mouth/Throat:     Lips: Pink.     Mouth: Mucous membranes are moist.     Pharynx: No oropharyngeal exudate.  Eyes:     General: No scleral icterus.       Right eye: No discharge.     Conjunctiva/sclera: Conjunctivae normal.     Pupils: Pupils are equal, round, and reactive to  light.  Neck:     Thyroid: No thyromegaly.     Trachea: No tracheal deviation.  Cardiovascular:     Rate and Rhythm: Normal rate and regular rhythm.     Heart sounds: Normal heart sounds. No murmur heard.  No friction rub.  Pulmonary:     Effort: Pulmonary effort is normal. No accessory muscle usage or respiratory distress.     Breath sounds: Normal breath sounds. No decreased breath sounds, wheezing, rhonchi or rales.  Chest:     Chest wall: No tenderness.     Breasts: Breasts are symmetrical.        Right: No inverted nipple, mass, nipple discharge, skin change or tenderness.        Left: No inverted nipple, mass, nipple discharge, skin change or tenderness.  Abdominal:     General: Bowel sounds are normal. There is no distension.     Palpations: Abdomen is soft. There is no mass.     Tenderness: There is no abdominal tenderness. There is no guarding or rebound.  Musculoskeletal:        General: No tenderness or deformity. Normal range of motion.     Cervical back: Normal range of motion and neck supple.  Lymphadenopathy:     Cervical: No cervical adenopathy.     Upper Body:     Right upper body: No supraclavicular, axillary, pectoral or epitrochlear adenopathy.     Left upper body: No supraclavicular, axillary, pectoral or epitrochlear adenopathy.  Skin:    General: Skin is warm and dry.     Findings: No erythema.  Neurological:     Mental Status: She is alert and oriented to person, place, and time.     Cranial Nerves: No cranial nerve deficit.     Sensory: Sensation is intact.     Motor: Motor function is intact.     Coordination: Coordination is intact. Coordination normal.     Gait: Gait is intact.     Deep Tendon Reflexes: Reflexes are normal and symmetric.     Reflex Scores:      Patellar reflexes are 2+ on the right side and 2+  on the left side. Psychiatric:        Mood and Affect: Mood is anxious.        Speech: Speech normal.        Behavior: Behavior normal.         Thought Content: Thought content normal.        Judgment: Judgment normal.          Patient has been counseled extensively about nutrition and exercise as well as the importance of adherence with medications and regular follow-up. The patient was given clear instructions to go to ER or return to medical center if symptoms don't improve, worsen or new problems develop. The patient verbalized understanding.   Follow-up: Return in about 4 weeks (around 09/30/2020) for meter check with luke. See me in 3 months.   Gildardo Pounds, FNP-BC [facility] and [place], [place]   09/02/2020, 11:49 AM

## 2020-09-23 ENCOUNTER — Ambulatory Visit: Payer: Self-pay | Admitting: Pharmacist

## 2020-09-24 ENCOUNTER — Other Ambulatory Visit: Payer: Self-pay

## 2020-09-24 ENCOUNTER — Ambulatory Visit: Payer: Self-pay | Attending: Nurse Practitioner | Admitting: Pharmacist

## 2020-09-24 ENCOUNTER — Other Ambulatory Visit: Payer: Self-pay | Admitting: Family Medicine

## 2020-09-24 ENCOUNTER — Encounter: Payer: Self-pay | Admitting: Pharmacist

## 2020-09-24 DIAGNOSIS — E1165 Type 2 diabetes mellitus with hyperglycemia: Secondary | ICD-10-CM

## 2020-09-24 DIAGNOSIS — E118 Type 2 diabetes mellitus with unspecified complications: Secondary | ICD-10-CM

## 2020-09-24 DIAGNOSIS — IMO0002 Reserved for concepts with insufficient information to code with codable children: Secondary | ICD-10-CM

## 2020-09-24 LAB — GLUCOSE, POCT (MANUAL RESULT ENTRY): POC Glucose: 245 mg/dl — AB (ref 70–99)

## 2020-09-24 MED ORDER — TRUEPLUS PEN NEEDLES 32G X 4 MM MISC
2 refills | Status: DC
Start: 2020-09-24 — End: 2021-01-01

## 2020-09-24 MED ORDER — LANTUS SOLOSTAR 100 UNIT/ML ~~LOC~~ SOPN: 10.0000 [IU] | PEN_INJECTOR | Freq: Every day | SUBCUTANEOUS | 2 refills | Status: DC

## 2020-09-24 MED FILL — ?BASAGLAR 100 UNITS/ML KWPE: 100 | 28 days supply | Qty: 3 | Fill #0

## 2020-09-24 MED FILL — TRUEplus 5-BEVEL PEN NEEDLE: 32G X 4 MM | 25 days supply | Qty: 100 | Fill #0

## 2020-09-24 NOTE — Progress Notes (Signed)
S:     No chief complaint on file.  Patient arrives well and in good spirits.  Presents for diabetes evaluation, education, and management. Patient was referred and last seen by Primary Care Provider, Bertram Denver, on 09/02/2020.   At visit with Zelda, patient was not taking diabetes medications. Patient has a history of noncompliance. Last A1c in September was >15.5, representing a daily average >398. Patient historically resistant to starting medications.   Today, patient reports appropriate adherence with both metformin and Victoza. She reports successful titration with both of these medications without side effects. Patient requesting increase in medications for better glycemic control today. She reports some confusion on what benefits she is receiving from her different medications.   Family/Social History: diabetes and HTN (mother and father)  [employer] affordability: Self pay   Medication adherence reported appropriate (historically has been an issue)   Current diabetes medications include: metformin 1g BID, Victoza 1.8 mg daily Current hypertension medications include: losartan 50 mg daily  Current hyperlipidemia medications include: atorvastatin 20 mg daily  Patient denies hypoglycemic events.  Patient reported dietary habits: Eats 3 meals/day  Recently cut out sweets and fatty meats  Drinks tea daily, does add sugar   Trying to cut back on pastas and breads   Eats a lot of yogurt, veggies, raisins  Patient-reported exercise habits: none reported   Patient reports nocturia (nighttime urination). (has improved since starting medications) Patient denies neuropathy (nerve pain). Patient denies visual changes. Patient denies self foot exams.     O:  Physical Exam   ROS   Lab Results  Component Value Date   HGBA1C >15.5 (H) 08/05/2020   There were no vitals filed for this visit.  Lipid Panel     Component Value Date/Time   CHOL  233 (H) 08/05/2020 1445   TRIG 332 (H) 08/05/2020 1445   HDL 68 08/05/2020 1445   CHOLHDL 3.4 08/05/2020 1445   LDLCALC 109 (H) 08/05/2020 1445    Patient presented with glucometer in hand, as well as a details journal with 4-5 readings per day.  Date AM (fasting) Prior to lunch Afternoon Night  11/2 209 - - -  11/1 180 148 164 n/a  10/31 253 193 146 245  10/30 170 261 182 390  10/29 173 259 213 235   POCT BG today in office: 245   Clinical Atherosclerotic Cardiovascular Disease (ASCVD): No  The 10-year ASCVD risk score Denman George DC Jr., et al., 2013) is: 6.4%   Values used to calculate the score:     Age: 54 years     Sex: Female     Is Non-Hispanic African American: No     Diabetic: Yes     Tobacco smoker: No     Systolic Blood Pressure: 162 mmHg     Is BP treated: Yes     HDL Cholesterol: 68 mg/dL     Total Cholesterol: 233 mg/dL    A/P: Diabetes longstanding currently uncontrolled. Patient is now able to verbalize appropriate hypoglycemia management plan. Medication adherence appears appropriate, however, adherence has historically been in issue. Blood sugars have improved since starting metformin and Victoza, however, they remain elevated. Had a discussion with patient today regarding options, including starting insulin. She is amenable to starting this today.  -Started basal insulin glargine (Lantus) 10 units once every night at bedtime. Advised to contact office if blood sugar drops below 70, or if she become symptomatic.  -Continued GLP-1 Victoza 1.8 mg  once daily injection.   -Continued metformin 1000 mg BID. Continue to take with meal.  -Extensively discussed pathophysiology of diabetes, recommended lifestyle interventions, dietary effects on blood sugar control -Counseled on s/sx of and management of hypoglycemia -Next A1C anticipated December.  ASCVD risk - primary prevention in patient with diabetes. Last LDL is not controlled. ASCVD risk score is not >20%  - moderate  intensity statin indicated. Aspirin is not indicated.  -Continued atorvastatin 20 mg.  -Of note, patient was requesting an increase in atorvastatin dose at today's visit. Patient had some confusion in atorvastatin's mechanism and benefits; it appeared she felt atorvastatin was controlling her blood sugars. Educated on this.   Written patient instructions provided.  Total time in face to face counseling 20 minutes.   Follow up Pharmacist Clinic Visit in 2 weeks.     Theodis Sato, PharmD PGY-1 [facility] Pharmacy Resident   09/24/2020 12:29 PM

## 2020-09-27 MED FILL — ?ATORVASTATIN 20 MG TABLET: 20 | 30 days supply | Qty: 30 | Fill #1

## 2020-09-27 MED FILL — TRUE METRIX TEST STRIP: 30 days supply | Qty: 100 | Fill #1

## 2020-10-08 ENCOUNTER — Ambulatory Visit: Payer: Self-pay | Admitting: Pharmacist

## 2020-10-11 MED FILL — !LANTUS SOLOSTAR 100UNITS/M: 100 | 28 days supply | Qty: 3 | Fill #1

## 2020-10-11 MED FILL — METFORMIN HCL 500 MG TABS: 500 | 30 days supply | Qty: 120 | Fill #1

## 2020-10-11 MED FILL — $VICTOZA 2-PAK 18MG/3ML PEN: 18 | 30 days supply | Qty: 9 | Fill #1

## 2020-10-16 ENCOUNTER — Ambulatory Visit: Payer: Self-pay | Attending: Nurse Practitioner | Admitting: Pharmacist

## 2020-10-16 ENCOUNTER — Other Ambulatory Visit: Payer: Self-pay | Admitting: Family Medicine

## 2020-10-16 ENCOUNTER — Encounter: Payer: Self-pay | Admitting: Pharmacist

## 2020-10-16 ENCOUNTER — Other Ambulatory Visit: Payer: Self-pay

## 2020-10-16 DIAGNOSIS — E118 Type 2 diabetes mellitus with unspecified complications: Secondary | ICD-10-CM

## 2020-10-16 DIAGNOSIS — E1165 Type 2 diabetes mellitus with hyperglycemia: Secondary | ICD-10-CM

## 2020-10-16 DIAGNOSIS — IMO0002 Reserved for concepts with insufficient information to code with codable children: Secondary | ICD-10-CM | POA: Insufficient documentation

## 2020-10-16 DIAGNOSIS — E785 Hyperlipidemia, unspecified: Secondary | ICD-10-CM

## 2020-10-16 LAB — GLUCOSE, POCT (MANUAL RESULT ENTRY): POC Glucose: 161 mg/dl — AB (ref 70–99)

## 2020-10-16 MED ORDER — ATORVASTATIN CALCIUM 40 MG PO TABS: 40.0000 mg | ORAL_TABLET | Freq: Every day | ORAL | 3 refills | Status: DC

## 2020-10-16 MED FILL — ?ATORVASTATIN 40MG TABLET: 40 | 30 days supply | Qty: 30 | Fill #0

## 2020-10-16 NOTE — Progress Notes (Signed)
S:     PCP: Bertram Denver, NP  Patient arrives in good spirits. Presents for diabetes evaluation, education, and management Patient was referred and last seen by Primary Care Provider on 09/02/2020; and last seen by clinical pharmacy on 09/24/20 at which Lantus 10 units daily was initiated.   Today, patient reports medication adherence and no side-effects with Lantus 10 units daily, Victoza 1.8 mg daily, and metformin twice daily. Reports checking sugars 3x/day and sugars overall within target goal range (see below). Reports nocturia has significantly improved. Additionally, reports taking 2 tablets of atorvastatin 20 mg daily instead of 1 tablet daily as prescribed.   Family/Social History: diabetes and HTN (mother and father)  Insurance coverage/medication affordability: self pay  Medication adherence reported good.   Current diabetes medications include: Lantus 10 units daily, metformin 1g BID, Victoza 1.8 mg daily Current hypertension medications include: losartan 50 mg daily (not taking) Current hyperlipidemia medications include: atorvastatin 20 mg daily (taking 2 tablets)  Patient denies hypoglycemic events.   Patient reported dietary habits: Eats 3 meals/day  Recently cut out sweets and fatty meats  Drinks tea daily, does add sugar   Trying to cut back on pastas and breads   Eats a lot of yogurt, veggies, raisins  Patient-reported exercise habits: none reported   Patient denies nocturia (nighttime urination).  Patient denies neuropathy (nerve pain). Patient denies visual changes. Patient denies self foot exams.     O:  POCT: 161 (post-prandial)  Home fasting blood sugars: 144, 165, 98, 91, 122, 125 2 hour post-meal/random blood sugars: 151, 122, 130, 135, 140 Night: 118, 83, 100, 159, 158, 108   Lab Results  Component Value Date   HGBA1C >15.5 (H) 08/05/2020   There were no vitals filed for this visit.  Lipid Panel     Component Value Date/Time    CHOL 233 (H) 08/05/2020 1445   TRIG 332 (H) 08/05/2020 1445   HDL 68 08/05/2020 1445   CHOLHDL 3.4 08/05/2020 1445   LDLCALC 109 (H) 08/05/2020 1445    Clinical Atherosclerotic Cardiovascular Disease (ASCVD): No  The 10-year ASCVD risk score Denman George DC Jr., et al., 2013) is: 6.4%   Values used to calculate the score:     Age: 40 years     Sex: Female     Is Non-Hispanic African American: No     Diabetic: Yes     Tobacco smoker: No     Systolic Blood Pressure: 162 mmHg     Is BP treated: Yes     HDL Cholesterol: 68 mg/dL     Total Cholesterol: 233 mg/dL    A/P: Diabetes longstanding currently uncontrolled, however home sugars and symptoms have significantly improved since adding Lantus. Medication adherence appears optimal. Patient is able to verbalize appropriate hypoglycemia management plan.  -Continued Lantus 10 units daily -Continued metformin 1g BID -Continued Victoza 1.8 mg daily -Extensively discussed pathophysiology of diabetes, recommended lifestyle interventions, dietary effects on blood sugar control -Counseled on s/sx of and management of hypoglycemia -Next A1C anticipated December 2021.   ASCVD risk - primary prevention in patient with diabetes. Last LDL is not controlled. ASCVD risk score is not >20%  - moderate intensity statin indicated. Patient increased atorvastatin to 40 mg daily due to fear of elevated cholesterol. Aspirin is not indicated.  -Increased atorvastatin 40 mg daily.   Written patient instructions provided.  Total time in face to face counseling 25 minutes.   Follow up Pharmacist Clinic Visit in 3 weeks  Lorel Monaco, PharmD, [place] PGY2 Ambulatory Care Resident [place]

## 2020-11-04 NOTE — Progress Notes (Signed)
    S:     PCP: Bertram Denver, NP  Patient arrives in good spirits. Presents for diabetes evaluation, education, and management. Patient was referred and last seen by Primary Care Provideron 09/02/2020. She was last seen by clinical pharmacy on 10/16/20 at which time home sugars were controlled and no medication changes were made to DM regimen.  Today, patient reports medication adherence with Lantus 10 units daily, metformin twice daily, and Victoza daily. Reports running out of Victoza yesterday and only able to administer 1.2 mg yesterday only. Home post-prandial sugars range between 105-170. Denies sugars <70.   Family/Social History:diabetes and HTN (mother and father)  Insurance coverage/medication affordability: self pay  Medication adherence reported good.   Current diabetes medications include:Lantus 10 units daily, metformin 1g BID, Victoza 1.8 mg daily Current hypertension medications include:losartan 50 mg daily Current hyperlipidemia medications include:atorvastatin 40 mg daily   Patient denies hypoglycemic events.  Patient reported dietary habits: Eats30meals/day  Recently cut out sweets and fatty meats  Drinks tea daily, does add sugar   Trying to cut back on pastas and breads   Eats a lot of yogurt, veggies, raisins  Patient-reported exercise habits: none reported   Patient denies nocturia (nighttime urination).  Patient denies neuropathy (nerve pain). Patient denies visual changes. Patient reports self foot exams.    O:  12/15 from 12/6 2 hour post-meal/random blood sugars: 119, 122, 170, 134, 153, 127, 114, 123, 99, 105, 110, 86, 128, 112, 106  Lab Results  Component Value Date   HGBA1C 9.0 (A) 11/06/2020   There were no vitals filed for this visit.  Lipid Panel     Component Value Date/Time   CHOL 233 (H) 08/05/2020 1445   TRIG 332 (H) 08/05/2020 1445   HDL 68 08/05/2020 1445   CHOLHDL 3.4 08/05/2020 1445   LDLCALC 109 (H) 08/05/2020  1445    Clinical Atherosclerotic Cardiovascular Disease (ASCVD): No  The 10-year ASCVD risk score Denman George DC Jr., et al., 2013) is: 6.4%   Values used to calculate the score:     Age: 54 years     Sex: Female     Is Non-Hispanic African American: No     Diabetic: Yes     Tobacco smoker: No     Systolic Blood Pressure: 162 mmHg     Is BP treated: Yes     HDL Cholesterol: 68 mg/dL     Total Cholesterol: 233 mg/dL    A/P: Diabetes longstanding currently uncontrolled, however, A1c today is 9% (down from >15.5%). Home sugars are within target range. Denies hypoglycemia. Medication adherence appears optimal. Congratulated patient on this achievement and encouraged patient to continue taking medication as prescribed. Patient verbalized understanding. Patient is able to verbalize appropriate hypoglycemia management plan.  -Continued Lantus 10 units daily -Continued metformin 1g BID -Continued Victoza 1.8 mg daily -Checked A1c Today -Extensively discussed pathophysiology of diabetes, recommended lifestyle interventions, dietary effects on blood sugar control -Counseled on s/sx of and management of hypoglycemia -Next A1C anticipated March 2022.   ASCVD risk - primary prevention in patient with diabetes. Last LDLis notcontrolled. ASCVD risk score is not>20% - moderate-highintensity statin indicated. Aspirin is notindicated.  -Continued atorvastatin 40 mg daily  Written patient instructions provided.  Total time in face to face counseling 25 minutes.   Follow up PCP Clinic Visit on 12/03/20   Fabio Neighbors, PharmD, BCPS PGY2 Ambulatory Care Resident [facility]  Pharmacy

## 2020-11-06 ENCOUNTER — Encounter: Payer: Self-pay | Admitting: Pharmacist

## 2020-11-06 ENCOUNTER — Other Ambulatory Visit: Payer: Self-pay

## 2020-11-06 ENCOUNTER — Ambulatory Visit: Payer: Self-pay | Attending: Nurse Practitioner | Admitting: Pharmacist

## 2020-11-06 ENCOUNTER — Encounter (INDEPENDENT_AMBULATORY_CARE_PROVIDER_SITE_OTHER): Payer: Self-pay

## 2020-11-06 DIAGNOSIS — E1165 Type 2 diabetes mellitus with hyperglycemia: Secondary | ICD-10-CM

## 2020-11-06 DIAGNOSIS — IMO0002 Reserved for concepts with insufficient information to code with codable children: Secondary | ICD-10-CM

## 2020-11-06 DIAGNOSIS — E118 Type 2 diabetes mellitus with unspecified complications: Secondary | ICD-10-CM

## 2020-11-06 LAB — POCT GLYCOSYLATED HEMOGLOBIN (HGB A1C): Hemoglobin A1C: 9 % — AB (ref 4.0–5.6)

## 2020-11-06 MED ORDER — VICTOZA 18 MG/3ML ~~LOC~~ SOPN: PEN_INJECTOR | SUBCUTANEOUS | 11 refills | Status: DC

## 2020-11-06 MED FILL — METFORMIN HCL 500 MG TABS: 500 | 30 days supply | Qty: 120 | Fill #2

## 2020-11-06 MED FILL — $LANTUS SOLOSTAR 100 UNITS/: 100 | 28 days supply | Qty: 3 | Fill #2

## 2020-11-06 MED FILL — $VICTOZA 2-PAK 18MG/3ML PEN: 18 | 30 days supply | Qty: 9 | Fill #2

## 2020-11-06 MED FILL — TRUE METRIX TEST STRIP: 30 days supply | Qty: 100 | Fill #2

## 2020-12-03 ENCOUNTER — Ambulatory Visit: Payer: Self-pay | Admitting: Nurse Practitioner

## 2020-12-03 ENCOUNTER — Ambulatory Visit: Payer: Self-pay | Attending: Nurse Practitioner | Admitting: Nurse Practitioner

## 2020-12-03 ENCOUNTER — Other Ambulatory Visit: Payer: Self-pay

## 2020-12-06 ENCOUNTER — Other Ambulatory Visit: Payer: Self-pay | Admitting: Family Medicine

## 2020-12-06 ENCOUNTER — Encounter (INDEPENDENT_AMBULATORY_CARE_PROVIDER_SITE_OTHER): Payer: Self-pay

## 2020-12-06 DIAGNOSIS — IMO0002 Reserved for concepts with insufficient information to code with codable children: Secondary | ICD-10-CM

## 2020-12-06 DIAGNOSIS — E1165 Type 2 diabetes mellitus with hyperglycemia: Secondary | ICD-10-CM

## 2020-12-06 MED FILL — $VICTOZA 2-PAK 18MG/3ML PEN: 18 | 30 days supply | Qty: 9 | Fill #3

## 2020-12-06 MED FILL — METFORMIN HCL 500 MG TABS: 500 | 30 days supply | Qty: 120 | Fill #3

## 2020-12-06 MED FILL — ?ATORVASTATIN 40MG TABLET: 40 | 30 days supply | Qty: 30 | Fill #1

## 2020-12-06 MED FILL — TRUE METRIX GLUCOSE TEST ST: 30 days supply | Qty: 100 | Fill #3

## 2020-12-06 MED FILL — $LANTUS SOLOSTAR 100 UNITS/: 100 | 28 days supply | Qty: 3 | Fill #0

## 2021-01-01 ENCOUNTER — Other Ambulatory Visit: Payer: Self-pay

## 2021-01-01 ENCOUNTER — Encounter: Payer: Self-pay | Admitting: Physician Assistant

## 2021-01-01 ENCOUNTER — Ambulatory Visit: Payer: Self-pay | Attending: Nurse Practitioner | Admitting: Physician Assistant

## 2021-01-01 ENCOUNTER — Other Ambulatory Visit: Payer: Self-pay | Admitting: Physician Assistant

## 2021-01-01 VITALS — BP 186/94 | HR 79 | Resp 16 | Wt 185.6 lb

## 2021-01-01 DIAGNOSIS — I10 Essential (primary) hypertension: Secondary | ICD-10-CM

## 2021-01-01 DIAGNOSIS — E1165 Type 2 diabetes mellitus with hyperglycemia: Secondary | ICD-10-CM

## 2021-01-01 DIAGNOSIS — E785 Hyperlipidemia, unspecified: Secondary | ICD-10-CM

## 2021-01-01 DIAGNOSIS — IMO0002 Reserved for concepts with insufficient information to code with codable children: Secondary | ICD-10-CM

## 2021-01-01 DIAGNOSIS — E118 Type 2 diabetes mellitus with unspecified complications: Secondary | ICD-10-CM

## 2021-01-01 LAB — GLUCOSE, POCT (MANUAL RESULT ENTRY): POC Glucose: 130 mg/dl — AB (ref 70–99)

## 2021-01-01 MED ORDER — ATORVASTATIN CALCIUM 40 MG PO TABS: 40.0000 mg | ORAL_TABLET | Freq: Every day | ORAL | 3 refills | Status: DC

## 2021-01-01 MED ORDER — LANTUS SOLOSTAR 100 UNIT/ML ~~LOC~~ SOPN: 10.0000 [IU] | PEN_INJECTOR | Freq: Every day | SUBCUTANEOUS | 2 refills | Status: DC

## 2021-01-01 MED ORDER — LOSARTAN POTASSIUM 100 MG PO TABS: 100.0000 mg | ORAL_TABLET | Freq: Every day | ORAL | 3 refills | Status: DC

## 2021-01-01 MED ORDER — METFORMIN HCL 500 MG PO TABS: 500.0000 mg | ORAL_TABLET | Freq: Two times a day (BID) | ORAL | 3 refills | Status: DC

## 2021-01-01 MED ORDER — TRUEPLUS PEN NEEDLES 32G X 4 MM MISC
2 refills | Status: DC
Start: 2021-01-01 — End: 2021-01-01

## 2021-01-01 MED ORDER — VICTOZA 18 MG/3ML ~~LOC~~ SOPN: PEN_INJECTOR | SUBCUTANEOUS | 11 refills | Status: DC

## 2021-01-01 MED FILL — METFORMIN HCL 500 MG TABS: 500 | 30 days supply | Qty: 120 | Fill #4

## 2021-01-01 MED FILL — LOSARTAN POTASSIUM 100 MG T: 100 | 33 days supply | Qty: 30 | Fill #0

## 2021-01-01 MED FILL — $VICTOZA 2-PAK 18MG/3ML PEN: 18 | 20 days supply | Qty: 6 | Fill #4

## 2021-01-01 MED FILL — $LANTUS SOLOSTAR 100 UNITS/: 100 | 28 days supply | Qty: 3 | Fill #1

## 2021-01-01 MED FILL — TRUEPLUS PEN NDL 32GX5/32: 32G X 4 MM | 25 days supply | Qty: 100 | Fill #0

## 2021-01-01 MED FILL — ?ATORVASTATIN 40MG TABLET: 40 | 30 days supply | Qty: 30 | Fill #2

## 2021-01-01 NOTE — Progress Notes (Signed)
Rebecca Atkins, is a 55 y.o. female  RXV:[id number]  [id number]:[id number]  DOB - 1966/06/18  Subjective:  Chief Complaint and HPI: Rebecca Atkins is a 55 y.o. female here today for med RF and blood sugar check. 80-120 fasting;  Up to 200 a couple of times but Usu ~150.  Compliant with blood sugar meds.  She is checking her blood sugars tid and has a log with herShe never started taking her blood pressure medications but she is willing to now.  No HA/CP/dizziness.    ROS:   Constitutional:  No f/c, No night sweats, No unexplained weight loss. EENT:  No vision changes, No blurry vision, No hearing changes. No mouth, throat, or ear problems.  Respiratory: No cough, No SOB Cardiac: No CP, no palpitations GI:  No abd pain, No N/V/D. GU: No Urinary s/sx Musculoskeletal: No joint pain Neuro: No headache, no dizziness, no motor weakness.  Skin: No rash Endocrine:  No polydipsia. No polyuria.  Psych: Denies SI/HI  No problems updated.  ALLERGIES: Allergies  Allergen Reactions  . Ibuprofen Rash    PAST MEDICAL HISTORY: Past Medical History:  Diagnosis Date  . Bipolar disorder with severe mania ([place])   . Diabetes mellitus without complication ([place])   . Hyperlipidemia   . Hypertension   . Noncompliance     MEDICATIONS AT HOME: Prior to Admission medications   Medication Sig Start Date End Date Taking? Authorizing Provider  losartan (COZAAR) 100 MG tablet Take 1 tablet (100 mg total) by mouth daily. (take 1/2 daily for the first week, then increase to 1 daily) 01/01/21  Yes McClung, Dionne Bucy, PA-C  ASPIRIN 81 PO Take by mouth.     [provider]  atorvastatin (LIPITOR) 40 MG tablet Take 1 tablet (40 mg total) by mouth daily. NEEDS PASS 01/01/21   Argentina Donovan, PA-C  Blood Glucose Monitoring Suppl (TRUE METRIX METER) w/Device KIT Use as instructed. Check blood glucose level by fingerstick three times per day. 09/02/20   Gildardo Pounds, NP  CALCIUM PO Take by mouth.  Patient  not taking: Reported on 10/16/2020    [provider]  glucose blood (TRUE METRIX BLOOD GLUCOSE TEST) test strip Use as instructed. Check blood glucose level by fingerstick three times per day. 09/02/20   Gildardo Pounds, NP  insulin glargine (LANTUS SOLOSTAR) 100 UNIT/ML Solostar Pen Inject 10 Units into the skin daily. 01/01/21   Argentina Donovan, PA-C  Insulin Pen Needle (TRUEPLUS PEN NEEDLES) 32G X 4 MM MISC Use as instructed to inject Lantus daily. 01/01/21   Argentina Donovan, PA-C  liraglutide (VICTOZA) 18 MG/3ML SOPN SubQ:  Inject 0.6 mg once daily into the skin. Week 2: increase to 1.2 mg once daily; week 3: increase to 1.8 mg once daily NEEDS PASS 01/01/21   Argentina Donovan, PA-C  metFORMIN (GLUCOPHAGE) 500 MG tablet Take 1 tablet (500 mg total) by mouth 2 (two) times daily with a meal. Increase to 2 tablets twice per day after 2 weeks if able to tolerate. NEEDS PASS 01/01/21   Argentina Donovan, PA-C  TRUEplus Lancets 28G MISC Use as instructed. Check blood glucose level by fingerstick three times per day. 09/02/20   Gildardo Pounds, NP     Objective:  EXAM:   Vitals:   01/01/21 1153  BP: (!) 186/94  Pulse: 79  Resp: 16  SpO2: 97%  Weight: 185 lb 9.6 oz (84.2 kg)    General appearance :  A&OX3. NAD. Non-toxic-appearing HEENT: Atraumatic and Normocephalic.  PERRLA. EOM intact.   Chest/Lungs:  Breathing-non-labored, Good air entry bilaterally, breath sounds normal without rales, rhonchi, or wheezing  CVS: S1 S2 regular, no murmurs, gallops, rubs  Extremities: Bilateral Lower Ext shows no edema, both legs are warm to touch with = pulse throughout Neurology:  CN II-XII grossly intact, Non focal.   Psych:  TP linear. J/I WNL. Normal speech. Appropriate eye contact and affect.  Skin:  No Rash  Data Review Lab Results  Component Value Date   HGBA1C 9.0 (A) 11/06/2020   HGBA1C >15.5 (H) 08/05/2020     Assessment & Plan   1. Diabetes mellitus type 2, uncontrolled, with  complications ([place]) improving control-continue same regimen and checking glucoses and follow diabetic diet.   - Glucose (CBG) - Insulin Pen Needle (TRUEPLUS PEN NEEDLES) 32G X 4 MM MISC; Use as instructed to inject Lantus daily.  Dispense: 100 each; Refill: 2 - liraglutide (VICTOZA) 18 MG/3ML SOPN; SubQ:  Inject 0.6 mg once daily into the skin. Week 2: increase to 1.2 mg once daily; week 3: increase to 1.8 mg once daily NEEDS PASS  Dispense: 9 mL; Refill: 11 - metFORMIN (GLUCOPHAGE) 500 MG tablet; Take 1 tablet (500 mg total) by mouth 2 (two) times daily with a meal. Increase to 2 tablets twice per day after 2 weeks if able to tolerate. NEEDS PASS  Dispense: 180 tablet; Refill: 3 - insulin glargine (LANTUS SOLOSTAR) 100 UNIT/ML Solostar Pen; Inject 10 Units into the skin daily.  Dispense: 3 mL; Refill: 2 - CBC with Differential/Platelet  2. Dyslipidemia, goal LDL below 70 - atorvastatin (LIPITOR) 40 MG tablet; Take 1 tablet (40 mg total) by mouth daily. NEEDS PASS  Dispense: 90 tablet; Refill: 3 - Lipid panel  3. Essential hypertension Uncontrolled-but wasn't taking meds - Comprehensive metabolic panel - losartan (COZAAR) 100 MG tablet; Take 1 tablet (100 mg total) by mouth daily. (take 1/2 daily for the first week, then increase to 1 daily)  Dispense: 90 tablet; Refill: 3 - CBC with Differential/Platelet     Patient have been counseled extensively about nutrition and exercise  Return in about 2 months (around 03/01/2021) for with PCP;  DM and htn.  The patient was given clear instructions to go to ER or return to medical center if symptoms don't improve, worsen or new problems develop. The patient verbalized understanding. The patient was told to call to get lab results if they haven't heard anything in the next week.     Freeman Caldron, PA-C [facility] [place], [place]   01/01/2021, 12:13 PM

## 2021-01-02 ENCOUNTER — Encounter: Payer: Self-pay | Admitting: *Deleted

## 2021-01-02 LAB — CBC WITH DIFFERENTIAL/PLATELET
Basophils Absolute: 0.1 10*3/uL (ref 0.0–0.2)
Basos: 0 %
EOS (ABSOLUTE): 0.5 10*3/uL — ABNORMAL HIGH (ref 0.0–0.4)
Eos: 4 %
Hematocrit: 37.4 % (ref 34.0–46.6)
Hemoglobin: 12.1 g/dL (ref 11.1–15.9)
Immature Grans (Abs): 0 10*3/uL (ref 0.0–0.1)
Immature Granulocytes: 0 %
Lymphocytes Absolute: 2.9 10*3/uL (ref 0.7–3.1)
Lymphs: 24 %
MCH: 24.1 pg — ABNORMAL LOW (ref 26.6–33.0)
MCHC: 32.4 g/dL (ref 31.5–35.7)
MCV: 75 fL — ABNORMAL LOW (ref 79–97)
Monocytes Absolute: 0.8 10*3/uL (ref 0.1–0.9)
Monocytes: 7 %
Neutrophils Absolute: 7.5 10*3/uL — ABNORMAL HIGH (ref 1.4–7.0)
Neutrophils: 65 %
Platelets: 364 10*3/uL (ref 150–450)
RBC: 5.02 x10E6/uL (ref 3.77–5.28)
RDW: 15.7 % — ABNORMAL HIGH (ref 11.7–15.4)
WBC: 11.7 10*3/uL — ABNORMAL HIGH (ref 3.4–10.8)

## 2021-01-02 LAB — COMPREHENSIVE METABOLIC PANEL
ALT: 8 IU/L (ref 0–32)
AST: 11 IU/L (ref 0–40)
Albumin/Globulin Ratio: 1.2 (ref 1.2–2.2)
Albumin: 4 g/dL (ref 3.8–4.9)
Alkaline Phosphatase: 85 IU/L (ref 44–121)
BUN/Creatinine Ratio: 15 (ref 9–23)
BUN: 9 mg/dL (ref 6–24)
Bilirubin Total: <0.2 mg/dL (ref 0.0–1.2)
CO2: 24 mmol/L (ref 20–29)
Calcium: 9.3 mg/dL (ref 8.7–10.2)
Chloride: 102 mmol/L (ref 96–106)
Creatinine, Ser: 0.61 mg/dL (ref 0.57–1.00)
GFR calc Af Amer: 120 mL/min/{1.73_m2} (ref >59)
GFR calc non Af Amer: 104 mL/min/{1.73_m2} (ref >59)
Globulin, Total: 3.4 g/dL (ref 1.5–4.5)
Glucose: 110 mg/dL — ABNORMAL HIGH (ref 65–99)
Potassium: 4.2 mmol/L (ref 3.5–5.2)
Sodium: 142 mmol/L (ref 134–144)
Total Protein: 7.4 g/dL (ref 6.0–8.5)

## 2021-01-02 LAB — LIPID PANEL
Chol/HDL Ratio: 2.7 ratio (ref 0.0–4.4)
Cholesterol, Total: 175 mg/dL (ref 100–199)
HDL: 66 mg/dL (ref >39)
LDL Chol Calc (NIH): 95 mg/dL (ref 0–99)
Triglycerides: 77 mg/dL (ref 0–149)
VLDL Cholesterol Cal: 14 mg/dL (ref 5–40)

## 2021-02-22 ENCOUNTER — Other Ambulatory Visit: Payer: Self-pay

## 2021-03-11 ENCOUNTER — Other Ambulatory Visit: Payer: Self-pay

## 2021-03-11 ENCOUNTER — Ambulatory Visit: Payer: Self-pay | Attending: Nurse Practitioner | Admitting: Nurse Practitioner

## 2021-03-11 DIAGNOSIS — E118 Type 2 diabetes mellitus with unspecified complications: Secondary | ICD-10-CM

## 2021-03-11 DIAGNOSIS — E785 Hyperlipidemia, unspecified: Secondary | ICD-10-CM

## 2021-03-11 DIAGNOSIS — IMO0002 Reserved for concepts with insufficient information to code with codable children: Secondary | ICD-10-CM

## 2021-03-11 DIAGNOSIS — E1165 Type 2 diabetes mellitus with hyperglycemia: Secondary | ICD-10-CM

## 2021-03-11 DIAGNOSIS — I10 Essential (primary) hypertension: Secondary | ICD-10-CM

## 2021-03-11 MED ORDER — ATORVASTATIN CALCIUM 40 MG PO TABS
ORAL_TABLET | Freq: Every day | ORAL | 3 refills | Status: DC
  Filled 2021-03-11: qty 30, 30d supply, fill #0
  Filled 2021-04-16: qty 30, 30d supply, fill #1
  Filled 2021-05-12: qty 30, 30d supply, fill #2

## 2021-03-11 MED ORDER — METFORMIN HCL 500 MG PO TABS
1000.0000 mg | ORAL_TABLET | Freq: Two times a day (BID) | ORAL | 3 refills | Status: DC
  Filled 2021-03-11: qty 120, 30d supply, fill #0
  Filled 2021-04-16: qty 120, 30d supply, fill #1
  Filled 2021-05-12: qty 120, 30d supply, fill #2

## 2021-03-11 MED ORDER — INSULIN PEN NEEDLE 32G X 4 MM MISC
2 refills | Status: DC
  Filled 2021-03-11: qty 100, fill #0
  Filled 2021-05-12: qty 100, 25d supply, fill #0

## 2021-03-11 MED ORDER — INSULIN GLARGINE 100 UNIT/ML SOLOSTAR PEN
10.0000 [IU] | PEN_INJECTOR | Freq: Every day | SUBCUTANEOUS | 2 refills | Status: DC
  Filled 2021-03-11: qty 3, 28d supply, fill #0
  Filled 2021-04-16: qty 3, 28d supply, fill #1
  Filled 2021-05-12: qty 3, 28d supply, fill #2

## 2021-03-11 MED ORDER — LISINOPRIL 40 MG PO TABS
40.0000 mg | ORAL_TABLET | Freq: Every day | ORAL | 0 refills | Status: DC
  Filled 2021-03-11: qty 30, 30d supply, fill #0
  Filled 2021-04-16: qty 30, 30d supply, fill #1
  Filled 2021-05-12: qty 30, 30d supply, fill #2

## 2021-03-11 MED ORDER — TRULICITY 0.75 MG/0.5ML ~~LOC~~ SOAJ
0.7500 mg | SUBCUTANEOUS | 3 refills | Status: DC
  Filled 2021-03-11: qty 2, 28d supply, fill #0
  Filled 2021-04-16: qty 2, 28d supply, fill #1
  Filled 2021-05-12: qty 2, 28d supply, fill #2

## 2021-03-11 NOTE — Progress Notes (Signed)
Virtual Visit via Telephone Note Due to national recommendations of social distancing due to COVID 19, telehealth visit is felt to be most appropriate for this patient at this time.  I discussed the limitations, risks, security and privacy concerns of performing an evaluation and management service by telephone and the availability of in person appointments. I also discussed with the patient that there may be a patient responsible charge related to this service. The patient expressed understanding and agreed to proceed.    I connected with Bronson Ing on 03/11/21  at   3:10 PM EDT  EDT by telephone and verified that I am speaking with the correct person using two identifiers.   Consent I discussed the limitations, risks, security and privacy concerns of performing an evaluation and management service by telephone and the availability of in person appointments. I also discussed with the patient that there may be a patient responsible charge related to this service. The patient expressed understanding and agreed to proceed.   Location of Patient: Private Residence   Location of Provider: [facility]    Persons participating in Telemedicine visit: Bertram Denver FNP-BC [facility] [place] CMA Bronson Ing    History of Present Illness: Telemedicine visit for: Follow up She has a past medical history of Bipolar disorder with severe mania Diabetes mellitus without complication, Hyperlipidemia, Hypertension, and Noncompliance.    DM 2 Poorly controlled. History of noncompliance. She states victoza causes "decreased energy" and she would like to discontinue and "Try something else". States Blood glucose this morning: 147. She is currently taking lantus 10units nightly. Will try her on trulicity weekly at this time. LDL not at [place] with atorvastatin 40 mg daily. She will continue to take metformin 1000 mg BID.  Lab Results  Component Value Date   HGBA1C 9.0 (A) 11/06/2020    Lab Results  Component Value Date   LDLCALC 95 01/01/2021    She stopped taking her blood pressure medication in February. Reportedly did not like the way losartan made her feel and it caused her blood glucose levels to increase. Will try lisinopril 40 mg daily. Denies chest pain, shortness of breath, palpitations, lightheadedness, dizziness, headaches or BLE edema.  BP Readings from Last 3 Encounters:  01/01/21 (!) 186/94  09/02/20 (!) 162/95    Past Medical History:  Diagnosis Date  . Bipolar disorder with severe mania (H[facility])   . Diabetes mellitus without complication (H[facility])   . Hyperlipidemia   . Hypertension   . Noncompliance     Past Surgical History:  Procedure Laterality Date  . NO PAST SURGERIES      Family History  Problem Relation Age of Onset  . Diabetes Mother   . Hypertension Mother   . Diabetes Father   . Hypertension Father     Social History   Socioeconomic History  . Marital status: Single    Spouse name: Not on file  . Number of children: Not on file  . Years of education: Not on file  . Highest education level: Not on file  Occupational History  . Not on file  Tobacco Use  . Smoking status: Never Smoker  . Smokeless tobacco: Never Used  Substance and Sexual Activity  . Alcohol use: Not Currently  . Drug use: Not Currently  . Sexual activity: Not Currently  Other Topics Concern  . Not on file  Social History Narrative  . Not on file   Social Determinants of Health   Financial  Resource Strain: Not on file  Food Insecurity: Not on file  Transportation Needs: Not on file  Physical Activity: Not on file  Stress: Not on file  Social Connections: Not on file     Observations/Objective: Awake, alert and oriented x 3   Review of Systems  Constitutional: Negative for fever, malaise/fatigue and weight loss.  HENT: Negative.  Negative for nosebleeds.   Eyes: Negative.  Negative for blurred vision, double vision and photophobia.   Respiratory: Negative.  Negative for cough and shortness of breath.   Cardiovascular: Negative.  Negative for chest pain, palpitations and leg swelling.  Gastrointestinal: Negative.  Negative for heartburn, nausea and vomiting.  Musculoskeletal: Negative.  Negative for myalgias.  Neurological: Negative.  Negative for dizziness, focal weakness, seizures and headaches.  Psychiatric/Behavioral: Negative.  Negative for suicidal ideas.    Assessment and Plan: Diagnoses and all orders for this visit:  Essential hypertension -     lisinopril (ZESTRIL) 40 MG tablet; Take 1 tablet (40 mg total) by mouth daily. Continue all antihypertensives as prescribed.  Remember to bring in your blood pressure log with you for your follow up appointment.  DASH/Mediterranean Diets are healthier choices for HTN.    Diabetes mellitus type 2, uncontrolled, with complications (HCC) -     metFORMIN (GLUCOPHAGE) 500 MG tablet; Take 2 tablets (1,000 mg total) by mouth 2 (two) times daily with a meal. -     insulin glargine (LANTUS) 100 UNIT/ML Solostar Pen; INJECT 10 UNITS INTO THE SKIN DAILY. -     Insulin Pen Needle 32G X 4 MM MISC; USE AS INSTRUCTED TO INJECT LANTUS DAILY. -     Dulaglutide (TRULICITY) 0.75 MG/0.5ML SOPN; Inject 0.75 mg into the skin once a week. Continue blood sugar control as discussed in office today, low carbohydrate diet, and regular physical exercise as tolerated, 150 minutes per week (30 min each day, 5 days per week, or 50 min 3 days per week). Keep blood sugar logs with fasting goal of 90-130 mg/dl, post prandial (after you eat) less than 180.  For Hypoglycemia: BS <60 and Hyperglycemia BS >400; contact the clinic ASAP. Annual eye exams and foot exams are recommended.   Dyslipidemia, goal LDL below 70 -     atorvastatin (LIPITOR) 40 MG tablet; TAKE 1 TABLET (40 MG TOTAL) BY MOUTH DAILY. INSTRUCTIONS: Work on a low fat, heart healthy diet and participate in regular aerobic exercise  program by working out at least 150 minutes per week; 5 days a week-30 minutes per day. Avoid red meat/beef/steak,  fried foods. junk foods, sodas, sugary drinks, unhealthy snacking, alcohol and smoking.  Drink at least 80 oz of water per day and monitor your carbohydrate intake daily.     Follow Up Instructions Return in about 3 months (around 06/10/2021).     I discussed the assessment and treatment plan with the patient. The patient was provided an opportunity to ask questions and all were answered. The patient agreed with the plan and demonstrated an understanding of the instructions.   The patient was advised to call back or seek an in-person evaluation if the symptoms worsen or if the condition fails to improve as anticipated.  I provided 20 minutes of non-face-to-face time during this encounter including median intraservice time, reviewing previous notes, labs, imaging, medications and explaining diagnosis and management.  Claiborne Rigg, FNP-BC

## 2021-03-12 ENCOUNTER — Other Ambulatory Visit: Payer: Self-pay

## 2021-03-13 ENCOUNTER — Encounter: Payer: Self-pay | Admitting: Nurse Practitioner

## 2021-03-13 ENCOUNTER — Other Ambulatory Visit: Payer: Self-pay

## 2021-03-27 ENCOUNTER — Ambulatory Visit: Payer: Self-pay | Attending: Family Medicine

## 2021-03-27 ENCOUNTER — Other Ambulatory Visit: Payer: Self-pay

## 2021-04-08 ENCOUNTER — Telehealth: Payer: Self-pay | Admitting: Nurse Practitioner

## 2021-04-08 NOTE — Telephone Encounter (Signed)
Pt was sent a letter from financial dept. Inform them, that the application they submitted was incomplete, since they were missing some documentation at the time of the appointment, Pt need to reschedule and resubmit all new papers and application for CAFA and OC, P.S. old documents has been sent back by mail to the Pt and Pt. need to make a new appt. 

## 2021-04-09 ENCOUNTER — Ambulatory Visit: Payer: Self-pay | Admitting: Nurse Practitioner

## 2021-04-16 ENCOUNTER — Other Ambulatory Visit: Payer: Self-pay

## 2021-04-16 MED FILL — Glucose Blood Test Strip: 25 days supply | Qty: 100 | Fill #0 | Status: AC

## 2021-04-25 ENCOUNTER — Other Ambulatory Visit: Payer: Self-pay

## 2021-04-28 ENCOUNTER — Other Ambulatory Visit: Payer: Self-pay

## 2021-04-28 ENCOUNTER — Ambulatory Visit: Payer: Self-pay | Attending: Nurse Practitioner

## 2021-05-12 ENCOUNTER — Other Ambulatory Visit: Payer: Self-pay

## 2021-05-12 MED FILL — Glucose Blood Test Strip: 25 days supply | Qty: 100 | Fill #1 | Status: AC

## 2021-06-03 ENCOUNTER — Other Ambulatory Visit: Payer: Self-pay

## 2021-06-03 ENCOUNTER — Ambulatory Visit: Payer: Self-pay | Attending: Nurse Practitioner | Admitting: Nurse Practitioner

## 2021-06-03 ENCOUNTER — Encounter: Payer: Self-pay | Admitting: Nurse Practitioner

## 2021-06-03 VITALS — BP 174/97 | HR 83 | Wt 194.8 lb

## 2021-06-03 DIAGNOSIS — E785 Hyperlipidemia, unspecified: Secondary | ICD-10-CM

## 2021-06-03 DIAGNOSIS — Z1231 Encounter for screening mammogram for malignant neoplasm of breast: Secondary | ICD-10-CM

## 2021-06-03 DIAGNOSIS — I10 Essential (primary) hypertension: Secondary | ICD-10-CM

## 2021-06-03 DIAGNOSIS — IMO0002 Reserved for concepts with insufficient information to code with codable children: Secondary | ICD-10-CM

## 2021-06-03 DIAGNOSIS — E1165 Type 2 diabetes mellitus with hyperglycemia: Secondary | ICD-10-CM

## 2021-06-03 DIAGNOSIS — D72829 Elevated white blood cell count, unspecified: Secondary | ICD-10-CM

## 2021-06-03 DIAGNOSIS — E118 Type 2 diabetes mellitus with unspecified complications: Secondary | ICD-10-CM

## 2021-06-03 LAB — GLUCOSE, POCT (MANUAL RESULT ENTRY): POC Glucose: 178 mg/dl — AB (ref 70–99)

## 2021-06-03 LAB — POCT GLYCOSYLATED HEMOGLOBIN (HGB A1C): HbA1c, POC (controlled diabetic range): 7.4 % — AB (ref 0.0–7.0)

## 2021-06-03 MED ORDER — TRULICITY 0.75 MG/0.5ML ~~LOC~~ SOAJ
0.7500 mg | SUBCUTANEOUS | 3 refills | Status: DC
Start: 2021-06-03 — End: 2021-09-03
  Filled 2021-06-03 – 2021-06-16 (×2): qty 2, 28d supply, fill #0
  Filled 2021-07-09: qty 2, 28d supply, fill #1
  Filled 2021-08-19: qty 2, 28d supply, fill #2

## 2021-06-03 MED ORDER — ATORVASTATIN CALCIUM 40 MG PO TABS
ORAL_TABLET | Freq: Every day | ORAL | 3 refills | Status: DC
  Filled 2021-06-03: qty 90, fill #0
  Filled 2021-06-16: qty 30, 30d supply, fill #0
  Filled 2021-07-09: qty 30, 30d supply, fill #1
  Filled 2021-08-19: qty 30, 30d supply, fill #2
  Filled 2021-10-24: qty 30, 30d supply, fill #3

## 2021-06-03 MED ORDER — GLUCOSE BLOOD VI STRP
ORAL_STRIP | 12 refills | Status: DC
  Filled 2021-06-03 – 2021-06-16 (×2): qty 100, 25d supply, fill #0
  Filled 2021-07-09: qty 100, 25d supply, fill #1
  Filled 2021-08-19: qty 100, 25d supply, fill #2
  Filled 2021-10-24: qty 100, 25d supply, fill #3

## 2021-06-03 MED ORDER — METFORMIN HCL 500 MG PO TABS
1000.0000 mg | ORAL_TABLET | Freq: Two times a day (BID) | ORAL | 3 refills | Status: DC
  Filled 2021-06-03 – 2021-06-16 (×2): qty 120, 30d supply, fill #0
  Filled 2021-07-09: qty 120, 30d supply, fill #1
  Filled 2021-08-19: qty 120, 30d supply, fill #2
  Filled 2021-10-24: qty 120, 30d supply, fill #3

## 2021-06-03 MED ORDER — INSULIN GLARGINE 100 UNIT/ML SOLOSTAR PEN
10.0000 [IU] | PEN_INJECTOR | Freq: Every day | SUBCUTANEOUS | 2 refills | Status: DC
  Filled 2021-06-03 (×2): qty 3, 30d supply, fill #0
  Filled 2021-07-02: qty 6, 56d supply, fill #1

## 2021-06-03 MED ORDER — TRUEPLUS LANCETS 28G MISC
3 refills | Status: AC
  Filled 2021-06-03 – 2022-02-11 (×2): qty 100, 30d supply, fill #0

## 2021-06-03 MED ORDER — AMLODIPINE BESYLATE 10 MG PO TABS
10.0000 mg | ORAL_TABLET | Freq: Every day | ORAL | 1 refills | Status: DC
  Filled 2021-06-03: qty 30, 30d supply, fill #0
  Filled 2021-07-02: qty 90, 90d supply, fill #1

## 2021-06-03 MED ORDER — LISINOPRIL 40 MG PO TABS
40.0000 mg | ORAL_TABLET | Freq: Every day | ORAL | 0 refills | Status: DC
  Filled 2021-06-03: qty 90, 90d supply, fill #0
  Filled 2021-06-16: qty 30, 30d supply, fill #0
  Filled 2021-07-09 (×2): qty 30, 30d supply, fill #1
  Filled 2021-08-19: qty 30, 30d supply, fill #2

## 2021-06-03 MED ORDER — INSULIN PEN NEEDLE 32G X 4 MM MISC
2 refills | Status: DC
  Filled 2021-06-03: qty 100, fill #0
  Filled 2021-07-02: qty 100, 25d supply, fill #0
  Filled 2021-10-24: qty 100, 25d supply, fill #1

## 2021-06-03 NOTE — Progress Notes (Signed)
Glucose number are going up and down. Discuss Lipitor dosage.

## 2021-06-03 NOTE — Progress Notes (Signed)
Assessment & Plan:  Rebecca Atkins was seen today for diabetes.  Diagnoses and all orders for this visit:  Diabetes mellitus type 2, uncontrolled, with complications ([place]) -     POCT glucose (manual entry) -     POCT glycosylated hemoglobin (Hb A1C) -     Ambulatory referral to Ophthalmology -     Dulaglutide (TRULICITY) 7.25 DG/6.4QI SOPN; Inject 0.75 mg into the skin once a week. -     glucose blood test strip; USE AS INSTRUCTED. CHECK BLOOD GLUCOSE LEVEL BY FINGERSTICK THREE TIMES PER DAY. -     insulin glargine (LANTUS) 100 UNIT/ML Solostar Pen; INJECT 10 UNITS INTO THE SKIN DAILY. -     Insulin Pen Needle 32G X 4 MM MISC; USE AS INSTRUCTED TO INJECT LANTUS DAILY. -     metFORMIN (GLUCOPHAGE) 500 MG tablet; Take 2 tablets (1,000 mg total) by mouth 2 (two) times daily with a meal. -     TRUEplus Lancets 28G MISC; USE AS INSTRUCTED. CHECK BLOOD GLUCOSE LEVEL BY FINGERSTICK THREE TIMES PER DAY. Continue blood sugar control as discussed in office today, low carbohydrate diet, and regular physical exercise as tolerated, 150 minutes per week (30 min each day, 5 days per week, or 50 min 3 days per week). Keep blood sugar logs with fasting goal of 90-130 mg/dl, post prandial (after you eat) less than 180.  For Hypoglycemia: BS <60 and Hyperglycemia BS >400; contact the clinic ASAP. Annual eye exams and foot exams are recommended.   Breast cancer screening by mammogram -     MM 3D SCREEN BREAST BILATERAL; Future  Essential hypertension -     CMP14+EGFR -     amLODipine (NORVASC) 10 MG tablet; Take 1 tablet (10 mg total) by mouth daily. -     lisinopril (ZESTRIL) 40 MG tablet; Take 1 tablet (40 mg total) by mouth daily. Continue all antihypertensives as prescribed.  Remember to bring in your blood pressure log with you for your follow up appointment.  DASH/Mediterranean Diets are healthier choices for HTN.     Leukocytosis, unspecified type -     CBC with Differential  Dyslipidemia, goal LDL  below 70 -     atorvastatin (LIPITOR) 40 MG tablet; TAKE 1 TABLET (40 MG TOTAL) BY MOUTH DAILY. -     Lipid panel INSTRUCTIONS: Work on a low fat, heart healthy diet and participate in regular aerobic exercise program by working out at least 150 minutes per week; 5 days a week-30 minutes per day. Avoid red meat/beef/steak,  fried foods. junk foods, sodas, sugary drinks, unhealthy snacking, alcohol and smoking.  Drink at least 80 oz of water per day and monitor your carbohydrate intake daily.    Patient has been counseled on age-appropriate routine health concerns for screening and prevention. These are reviewed and up-to-date. Referrals have been placed accordingly. Immunizations are up-to-date or declined.    Subjective:   Chief Complaint  Patient presents with   Diabetes   HPI Rebecca Atkins 55 y.o. female presents to office today for follow up to DM and HTN.   She declines pap smear stating she was told by her gynecologist if she was not sexually active she did not require a PAP smear.    DM 2 Diabetes improved on Trulicity 3.47QQ weekly and metformin 1000 mg BID. Meter reading averages: 7 day 131  14 day 140 30 day 149 LDL not at goal however it has improved since last reading. She is taking  atorvastatin 40 mg daily as prescribed.  Lab Results  Component Value Date   HGBA1C 7.4 (A) 06/03/2021    Lab Results  Component Value Date   HGBA1C 9.0 (A) 11/06/2020    Lab Results  Component Value Date   LDLCALC 95 01/01/2021     Essential Hypertension Currently taking lisinopril 40 mg daily. Will need to add amlodipine 10 mg today. Denies chest pain, shortness of breath, palpitations, lightheadedness, dizziness, headaches or BLE edema.   BP Readings from Last 3 Encounters:  06/03/21 (!) 174/97  01/01/21 (!) 186/94  09/02/20 (!) 162/95    Review of Systems  Constitutional:  Positive for malaise/fatigue. Negative for fever and weight loss.  HENT: Negative.  Negative for nosebleeds.    Eyes: Negative.  Negative for blurred vision, double vision and photophobia.  Respiratory: Negative.  Negative for cough and shortness of breath.   Cardiovascular: Negative.  Negative for chest pain, palpitations and leg swelling.  Gastrointestinal: Negative.  Negative for heartburn, nausea and vomiting.  Musculoskeletal: Negative.  Negative for myalgias.  Neurological: Negative.  Negative for dizziness, focal weakness, seizures and headaches.  Psychiatric/Behavioral: Negative.  Negative for suicidal ideas.    Past Medical History:  Diagnosis Date   Bipolar disorder with severe mania ([place])    Diabetes mellitus without complication ([place])    Hyperlipidemia    Hypertension    Noncompliance     Past Surgical History:  Procedure Laterality Date   NO PAST SURGERIES      Family History  Problem Relation Age of Onset   Diabetes Mother    Hypertension Mother    Diabetes Father    Hypertension Father     Social History Reviewed with no changes to be made today.   Outpatient Medications Prior to Visit  Medication Sig Dispense Refill   ASPIRIN 81 PO Take by mouth.      Blood Glucose Monitoring Suppl (TRUE METRIX GO GLUCOSE METER) w/Device KIT USE AS INSTRUCTED. CHECK BLOOD GLUCOSE LEVEL BY FINGERSTICK THREE TIMES PER DAY. 1 kit 0   Blood Glucose Monitoring Suppl (TRUE METRIX METER) w/Device KIT Use as instructed. Check blood glucose level by fingerstick three times per day. 1 kit 0   atorvastatin (LIPITOR) 40 MG tablet TAKE 1 TABLET (40 MG TOTAL) BY MOUTH DAILY. 90 tablet 3   Dulaglutide (TRULICITY) 5.78 IO/9.6EX SOPN Inject 0.75 mg into the skin once a week. 2 mL 3   glucose blood test strip USE AS INSTRUCTED. CHECK BLOOD GLUCOSE LEVEL BY FINGERSTICK THREE TIMES PER DAY. 100 strip 12   insulin glargine (LANTUS) 100 UNIT/ML Solostar Pen INJECT 10 UNITS INTO THE SKIN DAILY. 3 mL 2   Insulin Pen Needle 32G X 4 MM MISC USE AS INSTRUCTED TO INJECT LANTUS DAILY. 100 each 2   lisinopril  (ZESTRIL) 40 MG tablet Take 1 tablet (40 mg total) by mouth daily. 90 tablet 0   metFORMIN (GLUCOPHAGE) 500 MG tablet Take 2 tablets (1,000 mg total) by mouth 2 (two) times daily with a meal. 120 tablet 3   TRUEplus Lancets 28G MISC USE AS INSTRUCTED. CHECK BLOOD GLUCOSE LEVEL BY FINGERSTICK THREE TIMES PER DAY. 100 each 3   No facility-administered medications prior to visit.    Allergies  Allergen Reactions   Ibuprofen Rash       Objective:    BP (!) 174/97   Pulse 83   Wt 194 lb 12.8 oz (88.4 kg)   SpO2 98%   BMI 32.42 kg/m  Wt Readings from Last 3 Encounters:  06/03/21 194 lb 12.8 oz (88.4 kg)  01/01/21 185 lb 9.6 oz (84.2 kg)  09/02/20 185 lb (83.9 kg)    Physical Exam Vitals and nursing note reviewed.  Constitutional:      Appearance: She is well-developed.  HENT:     Head: Normocephalic and atraumatic.  Cardiovascular:     Rate and Rhythm: Normal rate and regular rhythm.     Heart sounds: Normal heart sounds. No murmur heard.   No friction rub. No gallop.  Pulmonary:     Effort: Pulmonary effort is normal. No tachypnea or respiratory distress.     Breath sounds: Normal breath sounds. No decreased breath sounds, wheezing, rhonchi or rales.  Chest:     Chest wall: No tenderness.  Abdominal:     General: Bowel sounds are normal.     Palpations: Abdomen is soft.  Musculoskeletal:        General: Normal range of motion.     Cervical back: Normal range of motion.  Skin:    General: Skin is warm and dry.  Neurological:     Mental Status: She is alert and oriented to person, place, and time.     Coordination: Coordination normal.  Psychiatric:        Behavior: Behavior normal. Behavior is cooperative.        Thought Content: Thought content normal.        Judgment: Judgment normal.         Patient has been counseled extensively about nutrition and exercise as well as the importance of adherence with medications and regular follow-up. The patient was given  clear instructions to go to ER or return to medical center if symptoms don't improve, worsen or new problems develop. The patient verbalized understanding.   Follow-up: Return for 3 weeks luke BP check. See me in 3 months.   Gildardo Pounds, FNP-BC [facility], [place]   06/03/2021, 12:22 PM

## 2021-06-04 LAB — CBC WITH DIFFERENTIAL/PLATELET
Basophils Absolute: 0.1 10*3/uL (ref 0.0–0.2)
Basos: 0 %
EOS (ABSOLUTE): 0.4 10*3/uL (ref 0.0–0.4)
Eos: 4 %
Hematocrit: 38.7 % (ref 34.0–46.6)
Hemoglobin: 11.9 g/dL (ref 11.1–15.9)
Immature Grans (Abs): 0 10*3/uL (ref 0.0–0.1)
Immature Granulocytes: 0 %
Lymphocytes Absolute: 2.3 10*3/uL (ref 0.7–3.1)
Lymphs: 21 %
MCH: 22.5 pg — ABNORMAL LOW (ref 26.6–33.0)
MCHC: 30.7 g/dL — ABNORMAL LOW (ref 31.5–35.7)
MCV: 73 fL — ABNORMAL LOW (ref 79–97)
Monocytes Absolute: 0.6 10*3/uL (ref 0.1–0.9)
Monocytes: 6 %
Neutrophils Absolute: 7.8 10*3/uL — ABNORMAL HIGH (ref 1.4–7.0)
Neutrophils: 69 %
Platelets: 381 10*3/uL (ref 150–450)
RBC: 5.28 x10E6/uL (ref 3.77–5.28)
RDW: 17.8 % — ABNORMAL HIGH (ref 11.7–15.4)
WBC: 11.2 10*3/uL — ABNORMAL HIGH (ref 3.4–10.8)

## 2021-06-04 LAB — CMP14+EGFR
ALT: 10 IU/L (ref 0–32)
AST: 14 IU/L (ref 0–40)
Albumin/Globulin Ratio: 1.5 (ref 1.2–2.2)
Albumin: 4.3 g/dL (ref 3.8–4.9)
Alkaline Phosphatase: 95 IU/L (ref 44–121)
BUN/Creatinine Ratio: 11 (ref 9–23)
BUN: 8 mg/dL (ref 6–24)
Bilirubin Total: 0.3 mg/dL (ref 0.0–1.2)
CO2: 24 mmol/L (ref 20–29)
Calcium: 9.9 mg/dL (ref 8.7–10.2)
Chloride: 99 mmol/L (ref 96–106)
Creatinine, Ser: 0.71 mg/dL (ref 0.57–1.00)
Globulin, Total: 2.9 g/dL (ref 1.5–4.5)
Glucose: 164 mg/dL — ABNORMAL HIGH (ref 65–99)
Potassium: 4.3 mmol/L (ref 3.5–5.2)
Sodium: 138 mmol/L (ref 134–144)
Total Protein: 7.2 g/dL (ref 6.0–8.5)
eGFR: 101 mL/min/{1.73_m2} (ref >59)

## 2021-06-04 LAB — LIPID PANEL
Chol/HDL Ratio: 2.7 ratio (ref 0.0–4.4)
Cholesterol, Total: 175 mg/dL (ref 100–199)
HDL: 66 mg/dL (ref >39)
LDL Chol Calc (NIH): 93 mg/dL (ref 0–99)
Triglycerides: 90 mg/dL (ref 0–149)
VLDL Cholesterol Cal: 16 mg/dL (ref 5–40)

## 2021-06-06 ENCOUNTER — Other Ambulatory Visit: Payer: Self-pay

## 2021-06-16 ENCOUNTER — Other Ambulatory Visit: Payer: Self-pay

## 2021-06-16 ENCOUNTER — Telehealth: Payer: Self-pay | Admitting: Nurse Practitioner

## 2021-06-16 NOTE — Telephone Encounter (Signed)
Patient came in requesting a dental referral because she states she needs fillings. Please f/u  Patient has the OC.

## 2021-06-16 NOTE — Telephone Encounter (Signed)
Pt needs referral to dentist she has OC

## 2021-06-19 ENCOUNTER — Other Ambulatory Visit: Payer: Self-pay | Admitting: Nurse Practitioner

## 2021-06-19 DIAGNOSIS — K089 Disorder of teeth and supporting structures, unspecified: Secondary | ICD-10-CM

## 2021-06-24 ENCOUNTER — Ambulatory Visit: Payer: Self-pay | Attending: Nurse Practitioner | Admitting: Pharmacist

## 2021-06-24 ENCOUNTER — Encounter: Payer: Self-pay | Admitting: Pharmacist

## 2021-06-24 ENCOUNTER — Other Ambulatory Visit: Payer: Self-pay

## 2021-06-24 VITALS — BP 138/87

## 2021-06-24 DIAGNOSIS — I10 Essential (primary) hypertension: Secondary | ICD-10-CM

## 2021-06-24 NOTE — Progress Notes (Signed)
   S:    PCP: Zelda   Patient arrives in good spirits. Presents to the clinic for hypertension evaluation, counseling, and management. Patient was referred and last seen by Primary Care Provider on 06/03/2021. BP was 174/97 mmHg at that visit. Amlodipine was added to her regimen.   Medication adherence reported. Of note, pt forgot her medications yesterday. In addition, she has not taken any medications today.   Current BP Medications include:  amlodipine 10 mg daily, lisinopril 40 mg daily   Dietary habits include: compliant with sodium restriction; drinks herbal tea but not caffeinated  Exercise habits include: walks around the house but does not participate in a formal exercise regimen  Family / Social history:  - Fhx: DM, HTN - Tobacco: never smoker  - Alcohol: denies use  O:  Vitals:   06/24/21 1527  BP: 138/87   Home BP readings:  -Brings her log with her today - SBPs are in the 130s-140s. - DBPs are in the 80s-90s.   Last 3 Office BP readings: BP Readings from Last 3 Encounters:  06/24/21 138/87  06/03/21 (!) 174/97  01/01/21 (!) 186/94   BMET    Component Value Date/Time   NA 138 06/03/2021 1118   K 4.3 06/03/2021 1118   CL 99 06/03/2021 1118   CO2 24 06/03/2021 1118   GLUCOSE 164 (H) 06/03/2021 1118   BUN 8 06/03/2021 1118   CREATININE 0.71 06/03/2021 1118   CALCIUM 9.9 06/03/2021 1118   GFRNONAA 104 01/01/2021 1207   GFRAA 120 01/01/2021 1207   Renal function: CrCl cannot be calculated (Patient's most recent lab result is older than the maximum 21 days allowed.).  Clinical ASCVD: No  The 10-year ASCVD risk score Denman George DC Jr., et al., 2013) is: 3.9%   Values used to calculate the score:     Age: 55 years     Sex: Female     Is Non-Hispanic African American: No     Diabetic: Yes     Tobacco smoker: No     Systolic Blood Pressure: 138 mmHg     Is BP treated: Yes     HDL Cholesterol: 66 mg/dL     Total Cholesterol: 175 mg/dL  A/P: Hypertension  longstanding currently close to goal on current medications. BP Goal = < 130/80 mmHg. Medication adherence reported.  -Continued current regimen.  -Counseled on lifestyle modifications for blood pressure control including reduced dietary sodium, increased exercise, adequate sleep.  Results reviewed and written information provided.   Total time in face-to-face counseling 30 minutes. F/U Clinic Visit in 4-6 weeks.    Butch Penny, PharmD, [place], CPP Clinical Pharmacist [facility] [phone]

## 2021-07-02 ENCOUNTER — Other Ambulatory Visit: Payer: Self-pay

## 2021-07-09 ENCOUNTER — Other Ambulatory Visit: Payer: Self-pay

## 2021-07-17 ENCOUNTER — Other Ambulatory Visit: Payer: Self-pay

## 2021-07-24 ENCOUNTER — Other Ambulatory Visit: Payer: Self-pay | Admitting: Nurse Practitioner

## 2021-07-24 DIAGNOSIS — Z1231 Encounter for screening mammogram for malignant neoplasm of breast: Secondary | ICD-10-CM

## 2021-08-01 ENCOUNTER — Other Ambulatory Visit: Payer: Self-pay | Admitting: Nurse Practitioner

## 2021-08-01 DIAGNOSIS — Z1231 Encounter for screening mammogram for malignant neoplasm of breast: Secondary | ICD-10-CM

## 2021-08-15 ENCOUNTER — Ambulatory Visit: Payer: Self-pay | Admitting: Pharmacist

## 2021-08-19 ENCOUNTER — Other Ambulatory Visit: Payer: Self-pay

## 2021-08-19 ENCOUNTER — Other Ambulatory Visit: Payer: Self-pay | Admitting: Nurse Practitioner

## 2021-08-19 DIAGNOSIS — IMO0002 Reserved for concepts with insufficient information to code with codable children: Secondary | ICD-10-CM

## 2021-08-19 DIAGNOSIS — E1165 Type 2 diabetes mellitus with hyperglycemia: Secondary | ICD-10-CM

## 2021-08-19 MED ORDER — LANTUS SOLOSTAR 100 UNIT/ML ~~LOC~~ SOPN
10.0000 [IU] | PEN_INJECTOR | Freq: Every day | SUBCUTANEOUS | 2 refills | Status: DC
  Filled 2021-08-19 – 2021-10-24 (×2): qty 3, 30d supply, fill #0

## 2021-08-20 ENCOUNTER — Other Ambulatory Visit: Payer: Self-pay

## 2021-09-03 ENCOUNTER — Other Ambulatory Visit: Payer: Self-pay

## 2021-09-03 ENCOUNTER — Encounter: Payer: Self-pay | Admitting: Nurse Practitioner

## 2021-09-03 ENCOUNTER — Ambulatory Visit: Payer: Self-pay | Attending: Nurse Practitioner | Admitting: Nurse Practitioner

## 2021-09-03 VITALS — BP 136/84 | HR 73 | Ht 65.0 in | Wt 198.4 lb

## 2021-09-03 DIAGNOSIS — Z794 Long term (current) use of insulin: Secondary | ICD-10-CM

## 2021-09-03 DIAGNOSIS — E1165 Type 2 diabetes mellitus with hyperglycemia: Secondary | ICD-10-CM

## 2021-09-03 DIAGNOSIS — I10 Essential (primary) hypertension: Secondary | ICD-10-CM

## 2021-09-03 LAB — POCT GLYCOSYLATED HEMOGLOBIN (HGB A1C): Hemoglobin A1C: 8.3 % — AB (ref 4.0–5.6)

## 2021-09-03 LAB — GLUCOSE, POCT (MANUAL RESULT ENTRY): POC Glucose: 263 mg/dl — AB (ref 70–99)

## 2021-09-03 MED ORDER — AMLODIPINE BESYLATE 10 MG PO TABS
10.0000 mg | ORAL_TABLET | Freq: Every day | ORAL | 1 refills | Status: DC
  Filled 2021-09-03 – 2021-10-24 (×2): qty 90, 90d supply, fill #0

## 2021-09-03 MED ORDER — TRULICITY 1.5 MG/0.5ML ~~LOC~~ SOAJ
1.5000 mg | SUBCUTANEOUS | 3 refills | Status: AC
  Filled 2021-09-03: qty 2, 28d supply, fill #0

## 2021-09-03 NOTE — Progress Notes (Signed)
Assessment & Plan:  [name] was seen today for diabetes and hypertension.  Diagnoses and all orders for this visit:  Type 2 diabetes mellitus with hyperglycemia, with long-term current use of insulin (HCC) -     POCT glucose (manual entry) -     POCT glycosylated hemoglobin (Hb A1C) -     Dulaglutide (TRULICITY) 1.5 EK/8.0KL SOPN; Inject 1.5 mg into the skin once a week.  Essential hypertension -     amLODipine (NORVASC) 10 MG tablet; Take 1 tablet (10 mg total) by mouth daily.   Patient has been counseled on age-appropriate routine health concerns for screening and prevention. These are reviewed and up-to-date. Referrals have been placed accordingly. Immunizations are up-to-date or declined.    Subjective:   Chief Complaint  Patient presents with   Diabetes   Hypertension   HPI Rebecca Atkins 55 y.o. female presents to office today for follow up to HTN and DM She has a past medical history of Bipolar disorder with severe mania, Diabetes mellitus without complication, Hyperlipidemia, Hypertension, and Noncompliance.    DM She does not monitor her blood glucose levels daily. Poorly controlled. She is taking metformin 1000 mg BID, lantus 10 units daily and trulicity 4.91 mg weekly. Increasing trulicity today to 1.5 mg weekly. She is not dietary adherent. There is a knowledge deficit noted today on carbs and how they affect diabetes. We did discuss this in great length today. LDL not at goal with atorvastatin 40 mg daily.  Lab Results  Component Value Date   HGBA1C 8.3 (A) 09/03/2021    Lab Results  Component Value Date   HGBA1C 7.4 (A) 06/03/2021   Lab Results  Component Value Date   LDLCALC 93 06/03/2021       HTN Well controlled today. However she notes BP readings at home 150-170/90-100s. Will need to bring her monitor with her to her next visit with me in a few months for correlation. Denies chest pain, shortness of breath, palpitations, lightheadedness, dizziness,  headaches or BLE edema.   BP Readings from Last 3 Encounters:  09/03/21 136/84  06/24/21 138/87  06/03/21 (!) 174/97     Review of Systems  Constitutional:  Negative for fever, malaise/fatigue and weight loss.  HENT: Negative.  Negative for nosebleeds.   Eyes: Negative.  Negative for blurred vision, double vision and photophobia.  Respiratory: Negative.  Negative for cough and shortness of breath.   Cardiovascular: Negative.  Negative for chest pain, palpitations and leg swelling.  Gastrointestinal: Negative.  Negative for heartburn, nausea and vomiting.  Musculoskeletal: Negative.  Negative for myalgias.  Neurological: Negative.  Negative for dizziness, focal weakness, seizures and headaches.  Psychiatric/Behavioral: Negative.  Negative for suicidal ideas.    Past Medical History:  Diagnosis Date   Bipolar disorder with severe mania ([place])    Diabetes mellitus without complication ([place])    Hyperlipidemia    Hypertension    Noncompliance     Past Surgical History:  Procedure Laterality Date   NO PAST SURGERIES      Family History  Problem Relation Age of Onset   Diabetes Mother    Hypertension Mother    Diabetes Father    Hypertension Father     Social History Reviewed with no changes to be made today.   Outpatient Medications Prior to Visit  Medication Sig Dispense Refill   ASPIRIN 81 PO Take by mouth.      atorvastatin (LIPITOR) 40 MG tablet TAKE 1 TABLET (40 MG  TOTAL) BY MOUTH DAILY. 90 tablet 3   Blood Glucose Monitoring Suppl (TRUE METRIX METER) w/Device KIT Use as instructed. Check blood glucose level by fingerstick three times per day. 1 kit 0   glucose blood test strip USE AS INSTRUCTED. CHECK BLOOD GLUCOSE LEVEL BY FINGERSTICK THREE TIMES PER DAY. 100 strip 12   insulin glargine (LANTUS SOLOSTAR) 100 UNIT/ML Solostar Pen INJECT 10 UNITS INTO THE SKIN DAILY. 3 mL 2   Insulin Pen Needle 32G X 4 MM MISC USE AS INSTRUCTED TO INJECT LANTUS DAILY. 100 each 2    lisinopril (ZESTRIL) 40 MG tablet Take 1 tablet (40 mg total) by mouth daily. 90 tablet 0   metFORMIN (GLUCOPHAGE) 500 MG tablet Take 2 tablets (1,000 mg total) by mouth 2 (two) times daily with a meal. 120 tablet 3   TRUEplus Lancets 28G MISC USE AS INSTRUCTED. CHECK BLOOD GLUCOSE LEVEL BY FINGERSTICK THREE TIMES PER DAY. 100 each 3   amLODipine (NORVASC) 10 MG tablet Take 1 tablet (10 mg total) by mouth daily. 90 tablet 1   Dulaglutide (TRULICITY) 9.38 BO/1.7PZ SOPN Inject 0.75 mg into the skin once a week. 2 mL 3   No facility-administered medications prior to visit.    Allergies  Allergen Reactions   Ibuprofen Rash       Objective:    BP 136/84   Pulse 73   Ht _0  (1.651 m)   Wt 198 lb 6 oz (90 kg)   SpO2 99%   BMI 33.01 kg/m  Wt Readings from Last 3 Encounters:  09/03/21 198 lb 6 oz (90 kg)  06/03/21 194 lb 12.8 oz (88.4 kg)  01/01/21 185 lb 9.6 oz (84.2 kg)    Physical Exam Vitals and nursing note reviewed.  Constitutional:      Appearance: She is well-developed.  HENT:     Head: Normocephalic and atraumatic.  Cardiovascular:     Rate and Rhythm: Normal rate and regular rhythm.     Heart sounds: Normal heart sounds. No murmur heard.   No friction rub. No gallop.  Pulmonary:     Effort: Pulmonary effort is normal. No tachypnea or respiratory distress.     Breath sounds: Normal breath sounds. No decreased breath sounds, wheezing, rhonchi or rales.  Chest:     Chest wall: No tenderness.  Abdominal:     General: Bowel sounds are normal.     Palpations: Abdomen is soft.  Musculoskeletal:        General: Normal range of motion.     Cervical back: Normal range of motion.  Skin:    General: Skin is warm and dry.  Neurological:     Mental Status: She is alert and oriented to person, place, and time.     Coordination: Coordination normal.  Psychiatric:        Behavior: Behavior normal. Behavior is cooperative.        Thought Content: Thought content normal.         Judgment: Judgment normal.         Patient has been counseled extensively about nutrition and exercise as well as the importance of adherence with medications and regular follow-up. The patient was given clear instructions to go to ER or return to medical center if symptoms don't improve, worsen or new problems develop. The patient verbalized understanding.   Follow-up: Return in about 4 weeks (around 10/01/2021) for meter check with luke in 4 weeks. See me in 3 months and bring BP machine  to visit.   Gildardo Pounds, FNP-BC [facility], [place]   09/03/2021, 9:55 PM

## 2021-09-04 ENCOUNTER — Ambulatory Visit
Admission: RE | Admit: 2021-09-04 | Discharge: 2021-09-04 | Disposition: A | Discharge status: Home / Self Care | Payer: No Typology Code available for payment source | Source: Ambulatory Visit | Attending: Nurse Practitioner | Admitting: Nurse Practitioner

## 2021-09-04 ENCOUNTER — Other Ambulatory Visit: Payer: Self-pay

## 2021-09-04 DIAGNOSIS — Z1231 Encounter for screening mammogram for malignant neoplasm of breast: Secondary | ICD-10-CM

## 2021-09-10 ENCOUNTER — Other Ambulatory Visit: Payer: Self-pay | Admitting: Nurse Practitioner

## 2021-09-10 ENCOUNTER — Other Ambulatory Visit: Payer: Self-pay

## 2021-09-10 DIAGNOSIS — R928 Other abnormal and inconclusive findings on diagnostic imaging of breast: Secondary | ICD-10-CM

## 2021-09-11 ENCOUNTER — Telehealth: Payer: Self-pay | Admitting: Nurse Practitioner

## 2021-09-11 ENCOUNTER — Other Ambulatory Visit: Payer: Self-pay | Admitting: Nurse Practitioner

## 2021-09-11 ENCOUNTER — Other Ambulatory Visit: Payer: Self-pay | Admitting: Obstetrics and Gynecology

## 2021-09-11 DIAGNOSIS — R928 Other abnormal and inconclusive findings on diagnostic imaging of breast: Secondary | ICD-10-CM

## 2021-09-11 NOTE — Telephone Encounter (Signed)
Copied from CRM [id number]. Topic: General - Call Back - No Documentation >> Sep 11, 2021  3:24 PM Crist Infante wrote: Reason for CRM: pt would like Zelda to call her back to discuss results of abnormal mammogram.

## 2021-09-17 NOTE — Telephone Encounter (Signed)
Patient declines further work up for abnormal mammogram. I have instructed her to let the breast center know this when they call her to schedule

## 2021-10-01 ENCOUNTER — Other Ambulatory Visit: Payer: Self-pay

## 2021-10-24 ENCOUNTER — Other Ambulatory Visit: Payer: Self-pay

## 2021-10-24 ENCOUNTER — Other Ambulatory Visit: Payer: Self-pay | Admitting: Nurse Practitioner

## 2021-10-24 ENCOUNTER — Ambulatory Visit: Payer: Self-pay | Admitting: Nurse Practitioner

## 2021-10-24 DIAGNOSIS — I10 Essential (primary) hypertension: Secondary | ICD-10-CM

## 2021-10-24 MED ORDER — LISINOPRIL 40 MG PO TABS
40.0000 mg | ORAL_TABLET | Freq: Every day | ORAL | 2 refills | Status: DC
  Filled 2021-10-24: qty 90, 90d supply, fill #0

## 2021-10-27 ENCOUNTER — Other Ambulatory Visit: Payer: Self-pay

## 2021-11-03 ENCOUNTER — Other Ambulatory Visit: Payer: Self-pay

## 2021-11-06 ENCOUNTER — Ambulatory Visit: Payer: No Typology Code available for payment source

## 2021-11-06 ENCOUNTER — Telehealth: Payer: Self-pay | Admitting: Nurse Practitioner

## 2021-11-06 NOTE — Telephone Encounter (Signed)
Copied from CRM [id number]. Topic: General - Other >> Nov 05, 2021  3:20 PM Glean Salen wrote: Reason for JKK:[id number] needs to reschedule appt with Mikle Bosworth. Please call back

## 2021-11-07 ENCOUNTER — Other Ambulatory Visit: Payer: Self-pay

## 2021-11-10 NOTE — Telephone Encounter (Signed)
I return Pt call, schedule for 11/26/21

## 2021-11-26 ENCOUNTER — Other Ambulatory Visit: Payer: Self-pay

## 2021-11-26 ENCOUNTER — Ambulatory Visit: Payer: No Typology Code available for payment source | Attending: Nurse Practitioner

## 2021-12-08 IMAGING — MG MM DIGITAL SCREENING BILAT W/ TOMO AND CAD
6 of 10 series · 6 of 30 positions shown · non-contrast
Comparison: None.
COMPARISON: None.

Addendum:
CLINICAL DATA: Screening.

EXAM:
DIGITAL SCREENING BILATERAL MAMMOGRAM WITH TOMOSYNTHESIS AND CAD
TECHNIQUE: Bilateral screening digital craniocaudal and mediolateral oblique
mammograms were obtained. Bilateral screening digital breast
tomosynthesis was performed. The images were evaluated with
computer-aided detection.

[L MLO synth-2D]
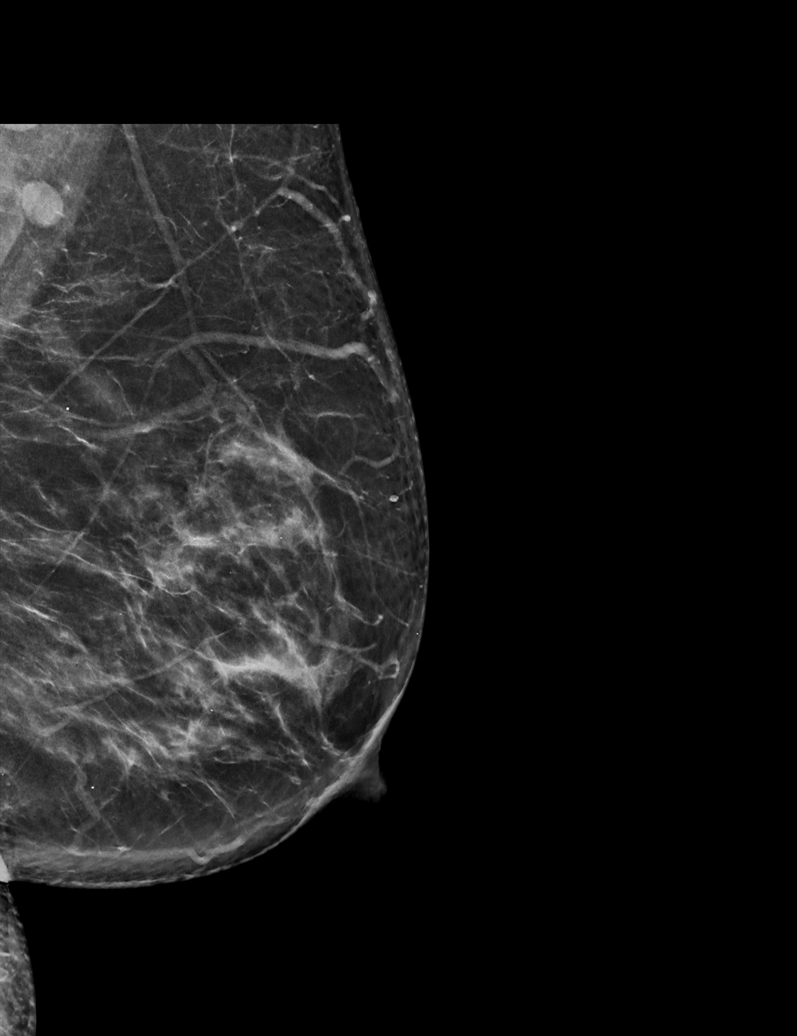

[R CC synth-2D]
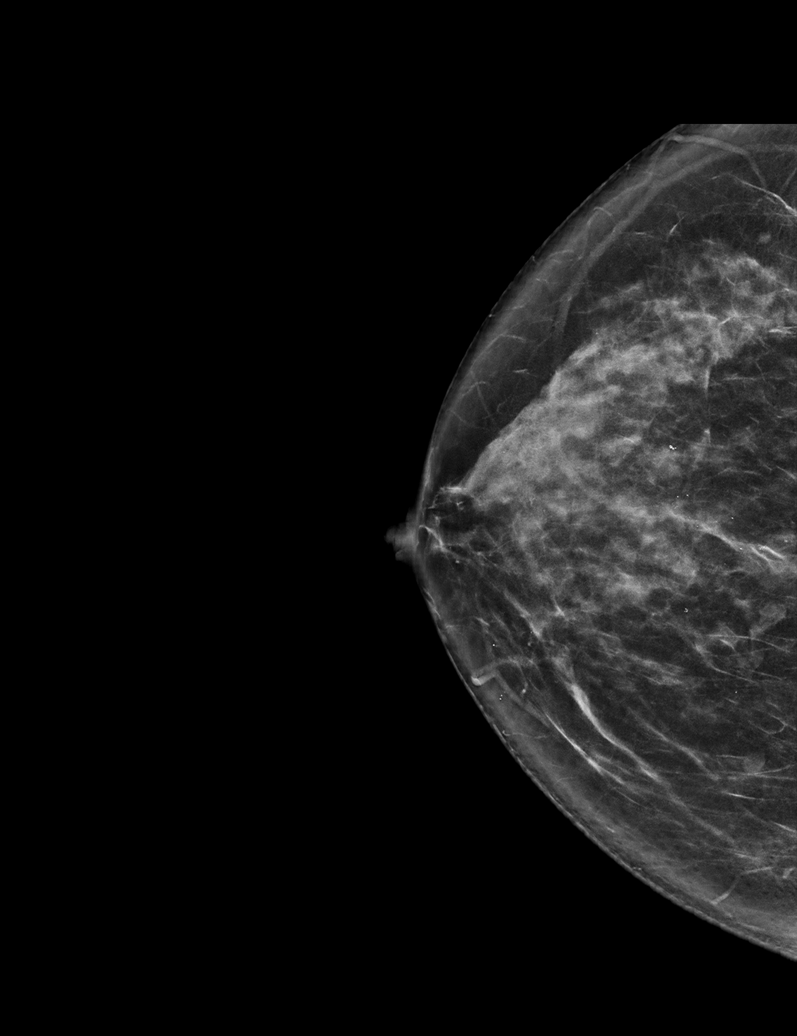

[R MLO synth-2D]
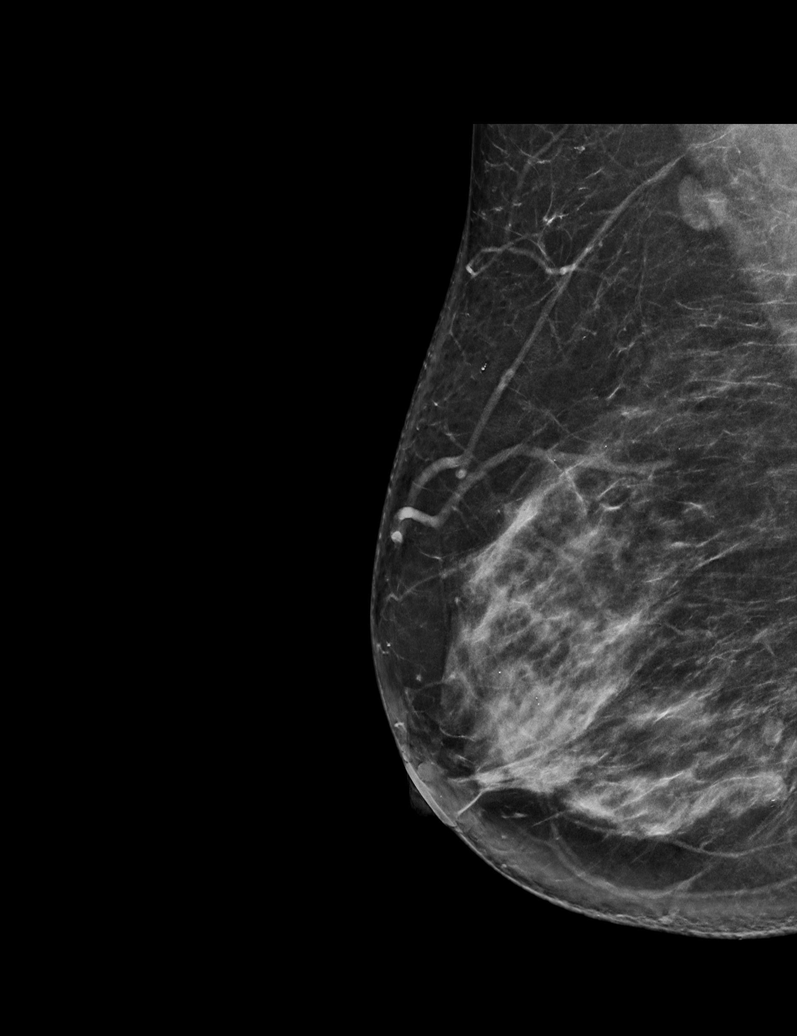

[L CC synth-2D]
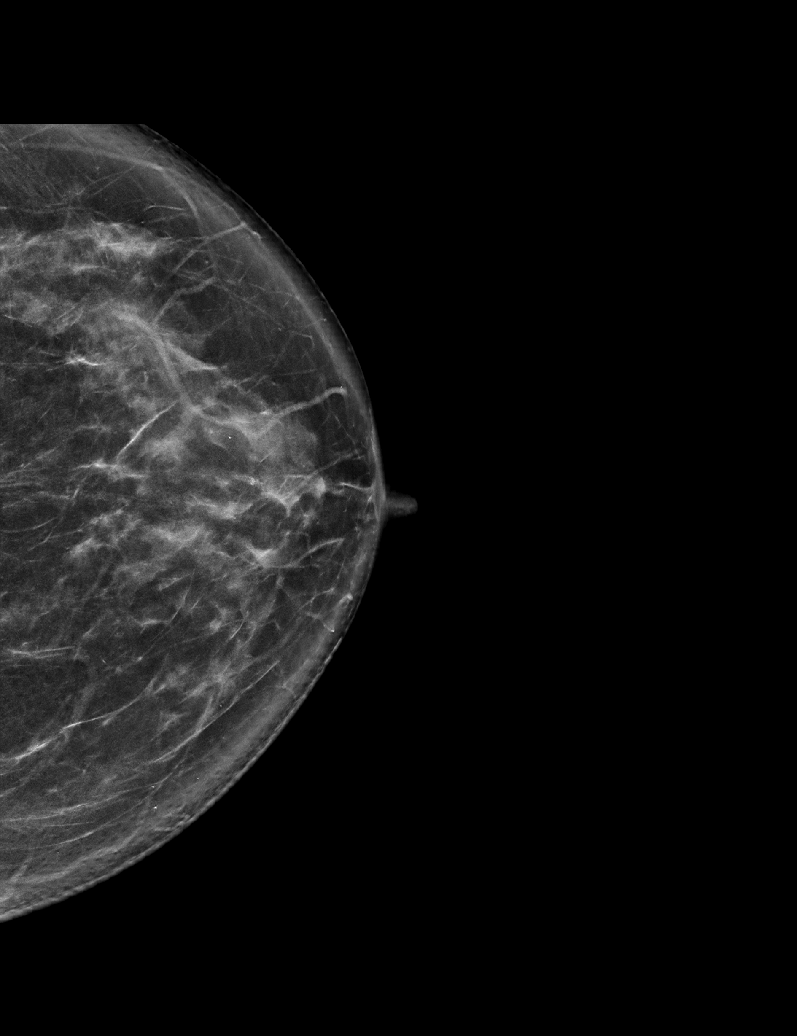

[L XCCL synth-2D]
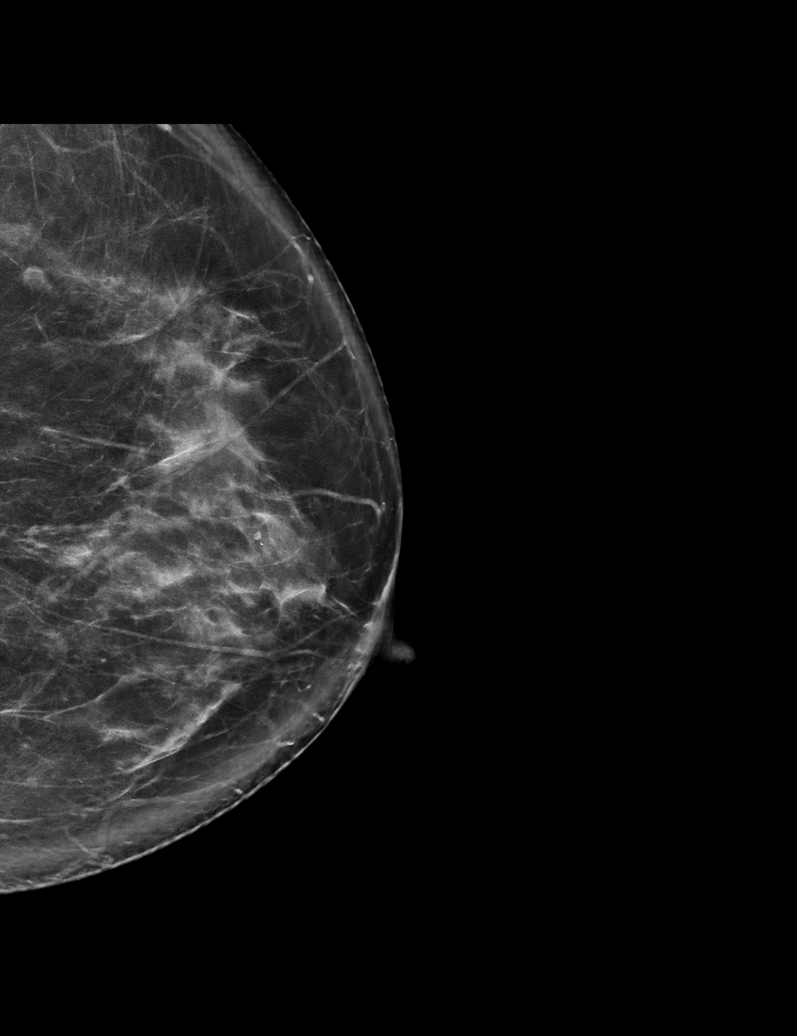

[R CC tomo · tomo slice 36/71.0]
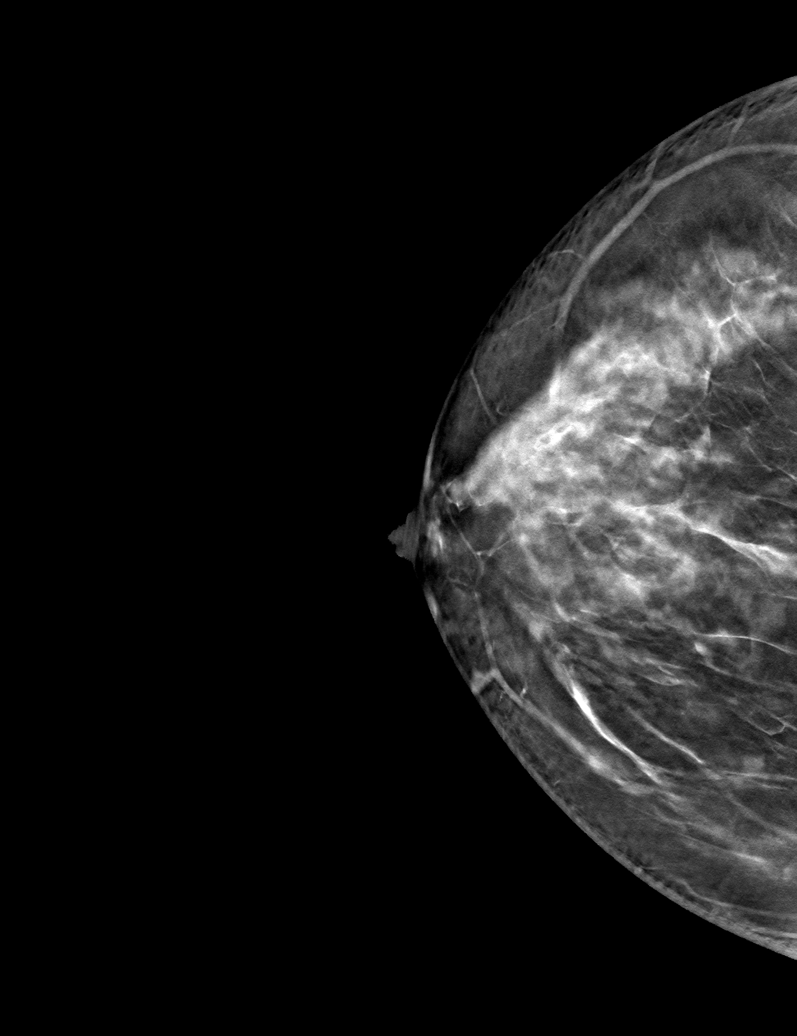

[6 of 30 positions shown; findings below may reference images not displayed]

Baseline

ACR Breast Density Category c: The breast tissue is heterogeneously
dense, which may obscure small masses.
FINDINGS: In the right breast, several adjacent focal asymmetries warrant
further evaluation.

In the left breast, an asymmetry on MLO view and LEFT axillary lymph
nodes warrant further evaluation.
IMPRESSION: Further evaluation is suggested for possible focal asymmetries in
the right breast.

Further evaluation is suggested for possible asymmetry and LEFT
axillary lymph node in the left breast.

RECOMMENDATION:
Diagnostic mammogram and possibly ultrasound of both breasts.
(Code:N7-3-ZZD)

The patient will be contacted regarding the findings, and additional
imaging will be scheduled.

BI-RADS CATEGORY  0: Incomplete. Need additional imaging evaluation
and/or prior mammograms for comparison.

ADDENDUM:
After receiving notification of the need for bilateral diagnostic
mammogram after recent baseline screening study, the patient sent a
letter to the [REDACTED] expressing her concern about radiation
exposure.

I reached out to the patient by telephone to discuss her concerns.
The patient is not willing to have additional mammographic views at
this time despite the low dose of mammography. She is willing to
have additional views performed no sooner than 1 year after her
screening study. We discussed the option of further evaluation with
bilateral breast ultrasound only, with the understanding that
ultrasound alone would not be able to exclude malignancy. We also
discussed the option of waiting a full year for diagnostic
evaluation to include mammogram and ultrasound.

The patient elects to wait until Tuesday August, 2022 for additional
views and possibly ultrasound of both breasts.

*** End of Addendum ***
Baseline

ACR Breast Density Category c: The breast tissue is heterogeneously
dense, which may obscure small masses.
FINDINGS: In the right breast, several adjacent focal asymmetries warrant
further evaluation.

In the left breast, an asymmetry on MLO view and LEFT axillary lymph
nodes warrant further evaluation.
IMPRESSION: Further evaluation is suggested for possible focal asymmetries in
the right breast.

Further evaluation is suggested for possible asymmetry and LEFT
axillary lymph node in the left breast.

RECOMMENDATION:
Diagnostic mammogram and possibly ultrasound of both breasts.
(Code:N7-3-ZZD)

The patient will be contacted regarding the findings, and additional
imaging will be scheduled.

BI-RADS CATEGORY  0: Incomplete. Need additional imaging evaluation
and/or prior mammograms for comparison.

## 2021-12-17 ENCOUNTER — Ambulatory Visit: Payer: Self-pay | Attending: Nurse Practitioner | Admitting: Nurse Practitioner

## 2021-12-17 ENCOUNTER — Encounter: Payer: Self-pay | Admitting: Nurse Practitioner

## 2021-12-17 ENCOUNTER — Other Ambulatory Visit: Payer: Self-pay

## 2021-12-17 DIAGNOSIS — R1084 Generalized abdominal pain: Secondary | ICD-10-CM

## 2021-12-17 DIAGNOSIS — K5901 Slow transit constipation: Secondary | ICD-10-CM

## 2021-12-17 MED ORDER — SENNOSIDES-DOCUSATE SODIUM 8.6-50 MG PO TABS: 1.0000 | ORAL_TABLET | Freq: Two times a day (BID) | ORAL | 6 refills | Status: AC

## 2021-12-17 NOTE — Progress Notes (Addendum)
Virtual Visit via Telephone Note Due to national recommendations of social distancing due to [place] 19, telehealth visit is felt to be most appropriate for this patient at this time.  I discussed the limitations, risks, security and privacy concerns of performing an evaluation and management service by telephone and the availability of in person appointments. I also discussed with the patient that there may be a patient responsible charge related to this service. The patient expressed understanding and agreed to proceed.    I connected with Rebecca Atkins on 12/19/21  at   4:10 PM EST  EDT by telephone and verified that I am speaking with the correct person using two identifiers.  Location of Patient: Private Residence   Location of Provider: [place] and [employer] Office    Persons participating in Telemedicine visit: Geryl Rankins FNP-BC Rebecca Atkins    History of Present Illness: Telemedicine visit for: Constipation She has a past medical history of Bipolar disorder with severe mania ([place]), DM 2, Hyperlipidemia, Hypertension, and Noncompliance.   Endorses 2 week onset of abdominal pain and decreased bowel movements. There are no associated symptoms.  The pain is described as aching and cramping, and is 5/10 in intensity. Pain is located diffusely without radiation. Symptoms have been waxing and waning since. Aggravating factors: none.  Alleviating factors: none. The patient denies hematochezia, hematuria, melena, and vomiting.     Past Medical History:  Diagnosis Date   Bipolar disorder with severe mania ([place])    Diabetes mellitus without complication ([place])    Hyperlipidemia    Hypertension    Noncompliance     Past Surgical History:  Procedure Laterality Date   NO PAST SURGERIES      Family History  Problem Relation Age of Onset   Diabetes Mother    Hypertension Mother    Diabetes Father    Hypertension Father     Social History   Socioeconomic History    Marital status: Single    Spouse name: Not on file   Number of children: Not on file   Years of education: Not on file   Highest education level: Not on file  Occupational History   Not on file  Tobacco Use   Smoking status: Never   Smokeless tobacco: Never  Substance and Sexual Activity   Alcohol use: Not Currently   Drug use: Not Currently   Sexual activity: Not Currently  Other Topics Concern   Not on file  Social History Narrative   Not on file   Social Determinants of Health   Financial Resource Strain: Not on file  Food Insecurity: Not on file  Transportation Needs: Not on file  Physical Activity: Not on file  Stress: Not on file  Social Connections: Not on file     Observations/Objective: Awake, alert and oriented x 3   Review of Systems  Constitutional:  Negative for fever, malaise/fatigue and weight loss.  HENT: Negative.  Negative for nosebleeds.   Eyes: Negative.  Negative for blurred vision, double vision and photophobia.  Respiratory: Negative.  Negative for cough and shortness of breath.   Cardiovascular: Negative.  Negative for chest pain, palpitations and leg swelling.  Gastrointestinal:  Positive for abdominal pain, constipation and diarrhea. Negative for heartburn, nausea and vomiting.  Musculoskeletal: Negative.  Negative for myalgias.  Neurological: Negative.  Negative for dizziness, focal weakness, seizures and headaches.  Psychiatric/Behavioral: Negative.  Negative for suicidal ideas.    Assessment and Plan: Diagnoses and all orders for  this visit:  Slow transit constipation -     senna-docusate (SENOKOT-S) 8.6-50 MG tablet; Take 1 tablet by mouth 2 (two) times daily.  Generalized abdominal pain Order labs  Follow Up Instructions Return in about 2 months (around 02/14/2022) for HTN/HPL/DM.     I discussed the assessment and treatment plan with the patient. The patient was provided an opportunity to ask questions and all were answered. The  patient agreed with the plan and demonstrated an understanding of the instructions.   The patient was advised to call back or seek an in-person evaluation if the symptoms worsen or if the condition fails to improve as anticipated.  I provided 11 minutes of non-face-to-face time during this encounter including median intraservice time, reviewing previous notes, labs, imaging, medications and explaining diagnosis and management.  Gildardo Pounds, FNP-BC

## 2021-12-19 ENCOUNTER — Telehealth: Payer: Self-pay | Admitting: Nurse Practitioner

## 2021-12-19 ENCOUNTER — Encounter: Payer: Self-pay | Admitting: Nurse Practitioner

## 2021-12-19 NOTE — Telephone Encounter (Signed)
There is a time stamp in [place] where I spoke with Rebecca Atkins for 10 minutes and 19 seconds on 12-17-2021 at 342pm. She can go back to her own cell phone at that time and see where I called her and talked to her in great detail about her stomach pain.  I also made her next f/u appt myself for March per her request. Not sure where the confusion is but I did speak with her

## 2021-12-19 NOTE — Telephone Encounter (Signed)
Copied from CRM [id number]. Topic: General - Other >> Dec 19, 2021  3:07 PM Jaquita Rector A wrote: Reason for CRM: Patient called in to inform Bertram Denver that the appointment that was scheduled for 12/17/21 never took place but she saw in My Chart where it is noted that appointment happened and medication ordered but patient stated she waited by the phone till after 5 PM and never got a call. Please advise and call Ph# [phone]

## 2022-02-11 ENCOUNTER — Encounter: Payer: Self-pay | Admitting: Nurse Practitioner

## 2022-02-11 ENCOUNTER — Other Ambulatory Visit: Payer: Self-pay

## 2022-02-11 ENCOUNTER — Ambulatory Visit: Payer: No Typology Code available for payment source | Attending: Nurse Practitioner | Admitting: Nurse Practitioner

## 2022-02-11 VITALS — BP 176/107 | HR 64 | Resp 16 | Ht 65.0 in | Wt 183.2 lb

## 2022-02-11 DIAGNOSIS — I10 Essential (primary) hypertension: Secondary | ICD-10-CM

## 2022-02-11 DIAGNOSIS — F319 Bipolar disorder, unspecified: Secondary | ICD-10-CM

## 2022-02-11 DIAGNOSIS — D72829 Elevated white blood cell count, unspecified: Secondary | ICD-10-CM

## 2022-02-11 DIAGNOSIS — Z794 Long term (current) use of insulin: Secondary | ICD-10-CM

## 2022-02-11 DIAGNOSIS — E1165 Type 2 diabetes mellitus with hyperglycemia: Secondary | ICD-10-CM

## 2022-02-11 DIAGNOSIS — E785 Hyperlipidemia, unspecified: Secondary | ICD-10-CM

## 2022-02-11 DIAGNOSIS — Z1211 Encounter for screening for malignant neoplasm of colon: Secondary | ICD-10-CM

## 2022-02-11 LAB — POCT GLYCOSYLATED HEMOGLOBIN (HGB A1C): Hemoglobin A1C: 14.2 % — AB (ref 4.0–5.6)

## 2022-02-11 LAB — GLUCOSE, POCT (MANUAL RESULT ENTRY): POC Glucose: 305 mg/dl — AB (ref 70–99)

## 2022-02-11 MED ORDER — AMLODIPINE BESYLATE 10 MG PO TABS
10.0000 mg | ORAL_TABLET | Freq: Every day | ORAL | 1 refills | Status: DC
  Filled 2022-02-11: qty 90, 90d supply, fill #0

## 2022-02-11 MED ORDER — ATORVASTATIN CALCIUM 40 MG PO TABS
ORAL_TABLET | Freq: Every day | ORAL | 3 refills | Status: AC
  Filled 2022-02-11: qty 90, 90d supply, fill #0

## 2022-02-11 MED ORDER — GLUCOSE BLOOD VI STRP
ORAL_STRIP | 12 refills | Status: AC
  Filled 2022-02-11: qty 100, 33d supply, fill #0

## 2022-02-11 MED ORDER — TRUE METRIX METER W/DEVICE KIT
PACK | 0 refills | Status: AC
  Filled 2022-02-11: qty 1, 30d supply, fill #0

## 2022-02-11 MED ORDER — TRULICITY 0.75 MG/0.5ML ~~LOC~~ SOAJ
0.7500 mg | SUBCUTANEOUS | 1 refills | Status: AC
  Filled 2022-02-11: qty 2, 28d supply, fill #0

## 2022-02-11 MED ORDER — INSULIN PEN NEEDLE 32G X 4 MM MISC
2 refills | Status: AC
  Filled 2022-02-11: qty 100, 25d supply, fill #0

## 2022-02-11 MED ORDER — LANTUS SOLOSTAR 100 UNIT/ML ~~LOC~~ SOPN
10.0000 [IU] | PEN_INJECTOR | Freq: Every day | SUBCUTANEOUS | 2 refills | Status: AC
  Filled 2022-02-11: qty 3, 30d supply, fill #0

## 2022-02-11 MED ORDER — LISINOPRIL 40 MG PO TABS
40.0000 mg | ORAL_TABLET | Freq: Every day | ORAL | 2 refills | Status: AC
  Filled 2022-02-11: qty 90, 90d supply, fill #0

## 2022-02-11 MED ORDER — METFORMIN HCL 500 MG PO TABS
1000.0000 mg | ORAL_TABLET | Freq: Two times a day (BID) | ORAL | 1 refills | Status: AC
  Filled 2022-02-11: qty 360, 90d supply, fill #0

## 2022-02-11 NOTE — Progress Notes (Signed)
? ?Assessment & Plan:  ?Dearra was seen today for diabetes. ? ?Diagnoses and all orders for this visit: ? ?Type 2 diabetes mellitus with hyperglycemia, with long-term current use of insulin ([place]) ?STARTED TRULICITY TODAY ?-     POCT glycosylated hemoglobin (Hb A1C) ?-     POCT glucose (manual entry) ?-     CMP14+EGFR ?-     Blood Glucose Monitoring Suppl (TRUE METRIX METER) w/Device KIT; Use as instructed. Check blood glucose level by fingerstick three times per day. ?-     metFORMIN (GLUCOPHAGE) 500 MG tablet; Take 2 tablets (1,000 mg total) by mouth 2 (two) times daily with a meal. ?-     insulin glargine (LANTUS SOLOSTAR) 100 UNIT/ML Solostar Pen; INJECT 10 UNITS INTO THE SKIN DAILY. ?-     Insulin Pen Needle 32G X 4 MM MISC; USE AS INSTRUCTED TO INJECT LANTUS DAILY. ?-     glucose blood test strip; USE AS INSTRUCTED. CHECK BLOOD GLUCOSE LEVEL BY FINGERSTICK THREE TIMES PER DAY. ?-     Dulaglutide (TRULICITY) 0.27 OZ/3.6UY SOPN; Inject 0.75 mg into the skin once a week. ? ?Essential hypertension ?-     amLODipine (NORVASC) 10 MG tablet; Take 1 tablet (10 mg total) by mouth daily. ?-     lisinopril (ZESTRIL) 40 MG tablet; Take 1 tablet (40 mg total) by mouth daily. ? ?Dyslipidemia, goal LDL below 70 ?-     Lipid panel ?-     atorvastatin (LIPITOR) 40 MG tablet; TAKE 1 TABLET (40 MG TOTAL) BY MOUTH DAILY. ? ?Colon cancer screening ?-     Fecal occult blood, imunochemical([facility]) ? ?Leukocytosis, unspecified type ?-     CBC with Differential ? ?Bipolar I disorder, in full remission ([place]) ?She is adamant that she did not give anyone permission to enter a diagnosis of bipolar disorder in her file. States she does not have bipolar disorder and this needs to be taken out of her chart as a diagnosis. She also states it is an invasion of her privacy for this diagnosis to be available for "everyone" to see in the system.  ? ? ?Patient has been counseled on age-appropriate routine health concerns for screening and  prevention. These are reviewed and up-to-date. Referrals have been placed accordingly. Immunizations are up-to-date or declined.    ?Subjective:  ? ?Chief Complaint  ?Patient presents with  ? Diabetes  ? ?HPI ?Rebecca Atkins 56 y.o. female presents to office today with complaints of abdominal pain.  ?She has a past medical history of Bipolar disorder with severe mania, DM2,  Hyperlipidemia, Hypertension, and Noncompliance.  ? ?DM 2 ?Diabetes is poorly controlled. She states she has been out of her insulin and metformin for over a month now. Although she had refills on the medications she did not pick them up. She is currently prescribed lantus 10 units daily and metformin 1000 mg BID. Will add Trulicity 4.03 mg weekly.  ?Lab Results  ?Component Value Date  ? HGBA1C 14.2 (A) 02/11/2022  ?  ?Lab Results  ?Component Value Date  ? HGBA1C 8.3 (A) 09/03/2021  ? ?Lab Results  ?Component Value Date  ? [place] 93 06/03/2021  ?  ? ?HTN ?Blood pressure is poorly controlled. She has not been taking lisinopril 40 mg or amlodipine 10 mg daily as prescribed as she ran out of these medications and never picked up her refills.  ?BP Readings from Last 3 Encounters:  ?02/11/22 (!) 176/107  ?09/03/21 136/84  ?06/24/21 138/87  ? ? ?  Endorses 2 week onset of abdominal pain and decreased bowel movements. There are no associated symptoms.  ?The pain is described as aching and cramping, and is 5/10 in intensity. Pain is located diffusely without radiation. Symptoms have been waxing and waning since. Aggravating factors: none.  Alleviating factors: none. The patient denies hematochezia, hematuria, melena, and vomiting. ? ? ?Review of Systems  ?Constitutional:  Negative for fever, malaise/fatigue and weight loss.  ?HENT: Negative.  Negative for nosebleeds.   ?Eyes: Negative.  Negative for blurred vision, double vision and photophobia.  ?Respiratory: Negative.  Negative for cough and shortness of breath.   ?Cardiovascular: Negative.  Negative for  chest pain, palpitations and leg swelling.  ?Gastrointestinal: Negative.  Negative for heartburn, nausea and vomiting.  ?Musculoskeletal: Negative.  Negative for myalgias.  ?Neurological: Negative.  Negative for dizziness, focal weakness, seizures and headaches.  ?Psychiatric/Behavioral: Negative.  Negative for suicidal ideas.   ? ?Past Medical History:  ?Diagnosis Date  ? Bipolar disorder with severe mania ([place])   ? Diabetes mellitus without complication ([place])   ? Hyperlipidemia   ? Hypertension   ? Noncompliance   ? ? ?Past Surgical History:  ?Procedure Laterality Date  ? NO PAST SURGERIES    ? ? ?Family History  ?Problem Relation Age of Onset  ? Diabetes Mother   ? Hypertension Mother   ? Diabetes Father   ? Hypertension Father   ? ? ?Social History Reviewed with no changes to be made today.  ? ?Outpatient Medications Prior to Visit  ?Medication Sig Dispense Refill  ? ASPIRIN 81 PO Take by mouth.     ? TRUEplus Lancets 28G MISC USE AS INSTRUCTED. CHECK BLOOD GLUCOSE LEVEL BY FINGERSTICK THREE TIMES PER DAY. 100 each 3  ? atorvastatin (LIPITOR) 40 MG tablet TAKE 1 TABLET (40 MG TOTAL) BY MOUTH DAILY. 90 tablet 3  ? Blood Glucose Monitoring Suppl (TRUE METRIX METER) w/Device KIT Use as instructed. Check blood glucose level by fingerstick three times per day. 1 kit 0  ? glucose blood test strip USE AS INSTRUCTED. CHECK BLOOD GLUCOSE LEVEL BY FINGERSTICK THREE TIMES PER DAY. 100 strip 12  ? insulin glargine (LANTUS SOLOSTAR) 100 UNIT/ML Solostar Pen INJECT 10 UNITS INTO THE SKIN DAILY. 3 mL 2  ? Insulin Pen Needle 32G X 4 MM MISC USE AS INSTRUCTED TO INJECT LANTUS DAILY. 100 each 2  ? lisinopril (ZESTRIL) 40 MG tablet Take 1 tablet (40 mg total) by mouth daily. 30 tablet 2  ? amLODipine (NORVASC) 10 MG tablet Take 1 tablet (10 mg total) by mouth daily. 90 tablet 1  ? metFORMIN (GLUCOPHAGE) 500 MG tablet Take 2 tablets (1,000 mg total) by mouth 2 (two) times daily with a meal. 120 tablet 3  ? ?No facility-administered  medications prior to visit.  ? ? ?Allergies  ?Allergen Reactions  ? Ibuprofen Rash  ? ? ?   ?Objective:  ?  ?BP (!) 176/107   Pulse 64   Resp 16   Ht 5' 5"  (1.651 m)   Wt 183 lb 4 oz (83.1 kg)   LMP 02/04/2022   SpO2 98%   BMI 30.49 kg/m?  ?Wt Readings from Last 3 Encounters:  ?02/11/22 183 lb 4 oz (83.1 kg)  ?09/03/21 198 lb 6 oz (90 kg)  ?06/03/21 194 lb 12.8 oz (88.4 kg)  ? ? ?Physical Exam ?Vitals and nursing note reviewed.  ?Constitutional:   ?   Appearance: She is well-developed.  ?HENT:  ?   Head: Normocephalic  and atraumatic.  ?Cardiovascular:  ?   Rate and Rhythm: Normal rate and regular rhythm.  ?   Heart sounds: Normal heart sounds. No murmur heard. ?  No friction rub. No gallop.  ?Pulmonary:  ?   Effort: Pulmonary effort is normal. No tachypnea or respiratory distress.  ?   Breath sounds: Normal breath sounds. No decreased breath sounds, wheezing, rhonchi or rales.  ?Chest:  ?   Chest wall: No tenderness.  ?Abdominal:  ?   General: Bowel sounds are normal.  ?   Palpations: Abdomen is soft.  ?Musculoskeletal:     ?   General: Normal range of motion.  ?   Cervical back: Normal range of motion.  ?Skin: ?   General: Skin is warm and dry.  ?Neurological:  ?   Mental Status: She is alert and oriented to person, place, and time.  ?   Coordination: Coordination normal.  ?Psychiatric:     ?   Behavior: Behavior normal. Behavior is cooperative.     ?   Thought Content: Thought content normal.     ?   Judgment: Judgment normal.  ? ? ? ? ?   ?Patient has been counseled extensively about nutrition and exercise as well as the importance of adherence with medications and regular follow-up. The patient was given clear instructions to go to ER or return to medical center if symptoms don't improve, worsen or new problems develop. The patient verbalized understanding.  ? ?Follow-up: Return for luke meter and blood pressure check in 4 weeks. See me in 3 months..  ? ?Gildardo Pounds, FNP-BC ?[facility] ?[place], [place] ?[phone]   ?02/11/2022, 8:40 PM ?

## 2022-02-12 LAB — CMP14+EGFR
ALT: 13 IU/L (ref 0–32)
AST: 15 IU/L (ref 0–40)
Albumin/Globulin Ratio: 1.5 (ref 1.2–2.2)
Albumin: 4.6 g/dL (ref 3.8–4.9)
Alkaline Phosphatase: 95 IU/L (ref 44–121)
BUN/Creatinine Ratio: 15 (ref 9–23)
BUN: 10 mg/dL (ref 6–24)
Bilirubin Total: 0.3 mg/dL (ref 0.0–1.2)
CO2: 23 mmol/L (ref 20–29)
Calcium: 9.8 mg/dL (ref 8.7–10.2)
Chloride: 96 mmol/L (ref 96–106)
Creatinine, Ser: 0.67 mg/dL (ref 0.57–1.00)
Globulin, Total: 3.1 g/dL (ref 1.5–4.5)
Glucose: 332 mg/dL — ABNORMAL HIGH (ref 70–99)
Potassium: 4.1 mmol/L (ref 3.5–5.2)
Sodium: 135 mmol/L (ref 134–144)
Total Protein: 7.7 g/dL (ref 6.0–8.5)
eGFR: 104 mL/min/{1.73_m2} (ref >59)

## 2022-02-12 LAB — CBC WITH DIFFERENTIAL/PLATELET
Basophils Absolute: 0.1 10*3/uL (ref 0.0–0.2)
Basos: 1 %
EOS (ABSOLUTE): 0.2 10*3/uL (ref 0.0–0.4)
Eos: 2 %
Hematocrit: 44.5 % (ref 34.0–46.6)
Hemoglobin: 14.2 g/dL (ref 11.1–15.9)
Immature Grans (Abs): 0 10*3/uL (ref 0.0–0.1)
Immature Granulocytes: 0 %
Lymphocytes Absolute: 3 10*3/uL (ref 0.7–3.1)
Lymphs: 31 %
MCH: 24.9 pg — ABNORMAL LOW (ref 26.6–33.0)
MCHC: 31.9 g/dL (ref 31.5–35.7)
MCV: 78 fL — ABNORMAL LOW (ref 79–97)
Monocytes Absolute: 0.6 10*3/uL (ref 0.1–0.9)
Monocytes: 6 %
Neutrophils Absolute: 5.7 10*3/uL (ref 1.4–7.0)
Neutrophils: 60 %
Platelets: 336 10*3/uL (ref 150–450)
RBC: 5.71 x10E6/uL — ABNORMAL HIGH (ref 3.77–5.28)
RDW: 14.6 % (ref 11.7–15.4)
WBC: 9.6 10*3/uL (ref 3.4–10.8)

## 2022-02-12 LAB — LIPID PANEL
Chol/HDL Ratio: 3.5 ratio (ref 0.0–4.4)
Cholesterol, Total: 278 mg/dL — ABNORMAL HIGH (ref 100–199)
HDL: 80 mg/dL (ref >39)
LDL Chol Calc (NIH): 179 mg/dL — ABNORMAL HIGH (ref 0–99)
Triglycerides: 113 mg/dL (ref 0–149)
VLDL Cholesterol Cal: 19 mg/dL (ref 5–40)

## 2023-03-19 ENCOUNTER — Telehealth (HOSPITAL_COMMUNITY): Payer: Self-pay | Admitting: Psychiatry

## 2023-03-23 NOTE — Telephone Encounter (Signed)
Provider called patient and was informed that she did not Regulatory affairs officer.  No other concerns at this time.

## 2023-03-31 ENCOUNTER — Ambulatory Visit (HOSPITAL_COMMUNITY)
Admission: EM | Admit: 2023-03-31 | Discharge: 2023-03-31 | Discharge status: Absent without leave | Payer: Medicaid Other

## 2023-04-06 ENCOUNTER — Encounter (HOSPITAL_COMMUNITY): Payer: Self-pay | Admitting: Psychiatry

## 2023-04-06 ENCOUNTER — Ambulatory Visit (INDEPENDENT_AMBULATORY_CARE_PROVIDER_SITE_OTHER): Payer: Medicaid Other | Admitting: Psychiatry

## 2023-04-06 DIAGNOSIS — Z91148 Patient's other noncompliance with medication regimen for other reason: Secondary | ICD-10-CM | POA: Diagnosis not present

## 2023-04-06 DIAGNOSIS — I10 Essential (primary) hypertension: Secondary | ICD-10-CM | POA: Diagnosis not present

## 2023-04-06 DIAGNOSIS — F310 Bipolar disorder, current episode hypomanic: Secondary | ICD-10-CM

## 2023-04-06 MED ORDER — AMLODIPINE BESYLATE 10 MG PO TABS: 10.0000 mg | ORAL_TABLET | Freq: Every day | ORAL | 1 refills | Status: AC
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
## 2023-04-08 ENCOUNTER — Telehealth (HOSPITAL_COMMUNITY): Payer: Self-pay | Admitting: Psychiatry

## 2023-04-08 ENCOUNTER — Other Ambulatory Visit (HOSPITAL_COMMUNITY): Payer: Self-pay | Admitting: Psychiatry

## 2023-04-08 ENCOUNTER — Other Ambulatory Visit: Payer: Self-pay

## 2023-04-08 ENCOUNTER — Telehealth (HOSPITAL_COMMUNITY): Payer: Self-pay | Admitting: *Deleted

## 2023-04-08 DIAGNOSIS — F310 Bipolar disorder, current episode hypomanic: Secondary | ICD-10-CM

## 2023-04-08 MED ORDER — ARIPIPRAZOLE 5 MG PO TABS
5.0000 mg | ORAL_TABLET | Freq: Every day | ORAL | 3 refills | Status: DC
  Filled 2023-04-08: qty 30, 30d supply, fill #0

## 2023-04-08 MED ORDER — ARIPIPRAZOLE 5 MG PO TABS: 5.0000 mg | ORAL_TABLET | Freq: Every day | ORAL | 3 refills | Status: DC

## 2023-04-08 NOTE — Telephone Encounter (Signed)
Asking for a return to discuss medication. She is now wanting to start the medication that was previously discussed.

## 2023-04-08 NOTE — Telephone Encounter (Signed)
At patient's last visit she endorsed paranoia, delusional thinking, and hypomania.  Today Abilify 5 mg started and sent to preferred pharmacy. Potential side effects of medication and risks vs benefits of treatment vs non-treatment were explained and discussed. All questions were answered.  No other concerns noted at this time.  

## 2023-04-08 NOTE — Telephone Encounter (Signed)
At patient's last visit she endorsed paranoia, delusional thinking, and hypomania.  Today Abilify 5 mg started and sent to preferred pharmacy. Potential side effects of medication and risks vs benefits of treatment vs non-treatment were explained and discussed. All questions were answered.  No other concerns noted at this time.

## 2023-04-13 ENCOUNTER — Telehealth (HOSPITAL_COMMUNITY): Payer: Self-pay | Admitting: *Deleted

## 2023-04-13 NOTE — Telephone Encounter (Signed)
Called  tracks for prior of Abilify 5mg . Spoke with Mia who gave approval until 04/07/24 auth # [id number] Called to notify pharmacy.

## 2023-04-21 ENCOUNTER — Encounter (HOSPITAL_COMMUNITY): Payer: Self-pay | Admitting: Psychiatry

## 2023-04-21 ENCOUNTER — Telehealth (INDEPENDENT_AMBULATORY_CARE_PROVIDER_SITE_OTHER): Payer: Medicaid Other | Admitting: Psychiatry

## 2023-04-21 DIAGNOSIS — F310 Bipolar disorder, current episode hypomanic: Secondary | ICD-10-CM

## 2023-04-21 MED ORDER — ARIPIPRAZOLE ER 400 MG IM PRSY
400.0000 mg | PREFILLED_SYRINGE | Freq: Once | INTRAMUSCULAR | Status: AC
Start: 2023-04-21 — End: 2023-05-05
  Administered 2023-05-05: 400 mg via INTRAMUSCULAR

## 2023-04-21 MED ORDER — ABILIFY MAINTENA 400 MG IM PRSY
400.0000 mg | PREFILLED_SYRINGE | INTRAMUSCULAR | 11 refills | Status: DC
Start: 2023-04-21 — End: 2023-05-31

## 2023-04-21 NOTE — Progress Notes (Signed)
BH MD/PA/NP OP Progress Note Virtual Visit via Video Note  I connected with Rebecca Atkins on 04/21/23 at  3:00 PM EDT by a video enabled telemedicine application and verified that I am speaking with the correct person using two identifiers.  Location: Patient: Home Provider: Clinic   I discussed the limitations of evaluation and management by telemedicine and the availability of in person appointments. The patient expressed understanding and agreed to proceed.  I provided 30 minutes of non-face-to-face time during this encounter.   04/21/2023 4:18 PM BENTLEIGH FOGLE  MRN:  [id number]  Chief Complaint: "I want the Abilify injection"  HPI: 57 year old female seen today for follow up psychiatric evaluation.  She has a psychiatric history of bipolar 1 disorder.  She is currently managed on Abilify 5 mg daily. She notes that her medications are effective in managing her psychiatric  conditions.     Today she was well-groomed, pleasant, cooperative, and engaged in conversation.  Patient is not hyperverbal during exam as she was at her prior visit.  She informed Clinical research associate that she likes Abilify and now wants to have the Abilify injection.  She denies having side effects from oral Abilify.  Patient notes that her mood is stable.  She also notes that her anxiety and depression are well-managed.  Today provider conducted a GAD-7 and patient scored a 0, at her last visit she scored a 0.  Provider also conducted PHQ-9 and patient scored a 0, at her last visit she scored a 0.  She endorses adequate sleep and appetite.  Today she denies SI/HI/VAH, mania, paranoia.  Provider asked patient if she had followed up with primary care regarding her hypertension.  She informed Clinical research associate that she has an appointment next week.  She informed Clinical research associate that her blood pressure continues to be elevated and notes that when it is elevated she is more fatigued.  Provider recommended continuing Norvasc and following up with PCP.  She  endorsed understanding and agreed.  Today Abilify Maintenna 400 mg ordered.  Patient will received her first injection on 04/27/2023.  Potential side effects of medication and risks vs benefits of treatment vs non-treatment were explained and discussed. All questions were answered. No other concerns noted at this time. Visit Diagnosis:    ICD-10-CM   1. Bipolar affective disorder, current episode hypomanic (HCC)  F31.0 ARIPiprazole ER (ABILIFY MAINTENA) 400 MG prefilled syringe 400 mg    ARIPiprazole ER (ABILIFY MAINTENA) 400 MG PRSY prefilled syringe      Past Psychiatric History: Bipolar disorder     Past Medical History:  Past Medical History:  Diagnosis Date   Bipolar disorder with severe mania (HCC)    Diabetes mellitus without complication (HCC)    Hyperlipidemia    Hypertension    Noncompliance     Past Surgical History:  Procedure Laterality Date   NO PAST SURGERIES      Family Psychiatric History: Denies  Family History:  Family History  Problem Relation Age of Onset   Diabetes Mother    Hypertension Mother    Diabetes Father    Hypertension Father     Social History:  Social History   Socioeconomic History   Marital status: Single    Spouse name: Not on file   Number of children: Not on file   Years of education: Not on file   Highest education level: Not on file  Occupational History   Not on file  Tobacco Use   Smoking status: Never  Smokeless tobacco: Never  Substance and Sexual Activity   Alcohol use: Not Currently   Drug use: Not Currently   Sexual activity: Not Currently  Other Topics Concern   Not on file  Social History Narrative   Not on file   Social Determinants of Health   Financial Resource Strain: Not on file  Food Insecurity: Not on file  Transportation Needs: Not on file  Physical Activity: Not on file  Stress: Not on file  Social Connections: Not on file    Allergies:  Allergies  Allergen Reactions   Ibuprofen Rash     Metabolic Disorder Labs: Lab Results  Component Value Date   HGBA1C 14.2 (A) 02/11/2022   No results found for: "PROLACTIN" Lab Results  Component Value Date   CHOL 278 (H) 02/11/2022   TRIG 113 02/11/2022   HDL 80 02/11/2022   CHOLHDL 3.5 02/11/2022   LDLCALC 179 (H) 02/11/2022   LDLCALC 93 06/03/2021   No results found for: "TSH"  Therapeutic Level Labs: No results found for: "LITHIUM" No results found for: "VALPROATE" No results found for: "CBMZ"  Current Medications: Current Outpatient Medications  Medication Sig Dispense Refill   ARIPiprazole ER (ABILIFY MAINTENA) 400 MG PRSY prefilled syringe Inject 400 mg into the muscle every 28 (twenty-eight) days. 1 each 11   amLODipine (NORVASC) 10 MG tablet Take 1 tablet (10 mg total) by mouth daily. 90 tablet 1   ASPIRIN 81 PO Take by mouth.      atorvastatin (LIPITOR) 40 MG tablet TAKE 1 TABLET (40 MG TOTAL) BY MOUTH DAILY. 90 tablet 3   Blood Glucose Monitoring Suppl (TRUE METRIX METER) w/Device KIT Use as instructed. Check blood glucose level by fingerstick three times per day. 1 kit 0   insulin glargine (LANTUS SOLOSTAR) 100 UNIT/ML Solostar Pen INJECT 10 UNITS INTO THE SKIN DAILY. 3 mL 2   lisinopril (ZESTRIL) 40 MG tablet Take 1 tablet (40 mg total) by mouth daily. 90 tablet 2   metFORMIN (GLUCOPHAGE) 500 MG tablet Take 2 tablets (1,000 mg total) by mouth 2 (two) times daily with a meal. 360 tablet 1   Current Facility-Administered Medications  Medication Dose Route Frequency Provider Last Rate Last Admin   ARIPiprazole ER (ABILIFY MAINTENA) 400 MG prefilled syringe 400 mg  400 mg Intramuscular Once Shanna Cisco, NP         Musculoskeletal: Strength & Muscle Tone: within normal limits and telehealth visit Gait & Station: normal, telehealth visit Patient leans: N/A  Psychiatric Specialty Exam: Review of Systems  There were no vitals taken for this visit.There is no height or weight on file to calculate  BMI.  General Appearance: Well Groomed  Eye Contact:  Good  Speech:  Clear and Coherent and Normal Rate  Volume:  Normal  Mood:   Euthymic  Affect:  Appropriate and Congruent  Thought Process:  Coherent, Goal Directed, and Linear  Orientation:  Full (Time, Place, and Person)  Thought Content: WDL and Logical   Suicidal Thoughts:  No  Homicidal Thoughts:  No  Memory:  Immediate;   Good Recent;   Good Remote;   Good  Judgement:  Good  Insight:  Good  Psychomotor Activity:  Normal  Concentration:  Concentration: Good and Attention Span: Good  Recall:  Good  Fund of Knowledge: Good  Language: Good  Akathisia:  No  Handed:  Right  AIMS (if indicated): not done  Assets:  Communication Skills Desire for Improvement Financial Resources/Insurance Housing Physical Health  Social Support  ADL's:  Intact  Cognition: WNL  Sleep:  Good   Screenings: GAD-7    Flowsheet Row Video Visit from 04/21/2023 in [facility] Office Visit from 04/06/2023 in [facility] Office Visit from 02/11/2022 in [facility] Office Visit from 09/03/2021 in [facility] Office Visit from 06/03/2021 in [facility]  Total GAD-7 Score 0 0 0 0 0      PHQ2-9    Flowsheet Row Video Visit from 04/21/2023 in [facility] Office Visit from 04/06/2023 in [facility] Office Visit from 02/11/2022 in [facility] Office Visit from 09/03/2021 in [facility] Office Visit from 06/03/2021 in [facility]  PHQ-2 Total Score 0 0 0 0 0  PHQ-9 Total Score 0 0 0 0 1        Assessment and Plan: Patient informed writer that her mood is more stable.  She denies symptoms of anxiety, depression, or psychosis.  Since  starting Abilify patient does not present with delusional thinking or rapid speech.  She informed Clinical research associate that she is interested in receiving Abilify LAI. Today Abilify Maintenna 400 mg ordered.  Patient will received her first injection on 04/27/2023.    1. Bipolar affective disorder, current episode hypomanic (HCC)  Start- ARIPiprazole ER (ABILIFY MAINTENA) 400 MG prefilled syringe 400 mg Stat- ARIPiprazole ER (ABILIFY MAINTENA) 400 MG PRSY prefilled syringe; Inject 400 mg into the muscle every 28 (twenty-eight) days.  Dispense: 1 each; Refill: 11  Collaboration of Care: Collaboration of Care: Other provider involved in patient's care AEB PCP and shot clinic staff  Patient/Guardian was advised Release of Information must be obtained prior to any record release in order to collaborate their care with an outside provider. Patient/Guardian was advised if they have not already done so to contact the registration department to sign all necessary forms in order for [place] to release information regarding their care.   Consent: Patient/Guardian gives verbal consent for treatment and assignment of benefits for services provided during this visit. Patient/Guardian expressed understanding and agreed to proceed.   Follow up in 2.5 months Follow up with shot clinic Shanna Cisco, NP 04/21/2023, 4:18 PM

## 2023-04-22 ENCOUNTER — Telehealth (HOSPITAL_COMMUNITY): Payer: Self-pay | Admitting: *Deleted

## 2023-04-22 NOTE — Telephone Encounter (Signed)
Fax received for prior authorization of Abilify Maintena 400mg . Called [place] tracks spoke with Mia who gave approval. #[id number]. Called to notify pharmacy.

## 2023-04-27 ENCOUNTER — Ambulatory Visit (HOSPITAL_COMMUNITY): Payer: Medicaid Other

## 2023-04-28 NOTE — Telephone Encounter (Signed)
Call from patient not able to get her abilify inj, asking for the clinics help. I called her pharmacy and it is showing it needs a PA unless we change the rx to every 30 days. It is currently written for every 28. She made the change in the pharmacy and it can be picked up by the patient tomorrow after 2. I called the patient back to explain, she will pick it up at 2 and come with it to get her shot before 4 pm tomorrow.

## 2023-04-29 ENCOUNTER — Ambulatory Visit (HOSPITAL_COMMUNITY): Payer: Medicaid Other

## 2023-05-05 ENCOUNTER — Ambulatory Visit (INDEPENDENT_AMBULATORY_CARE_PROVIDER_SITE_OTHER): Payer: Medicaid Other

## 2023-05-05 ENCOUNTER — Encounter (HOSPITAL_COMMUNITY): Payer: Self-pay

## 2023-05-05 VITALS — BP 108/72 | HR 85 | Ht 65.0 in | Wt 179.4 lb

## 2023-05-05 DIAGNOSIS — F310 Bipolar disorder, current episode hypomanic: Secondary | ICD-10-CM | POA: Diagnosis not present

## 2023-05-05 DIAGNOSIS — G4700 Insomnia, unspecified: Secondary | ICD-10-CM

## 2023-05-05 DIAGNOSIS — F200 Paranoid schizophrenia: Secondary | ICD-10-CM

## 2023-05-05 DIAGNOSIS — F411 Generalized anxiety disorder: Secondary | ICD-10-CM

## 2023-05-05 NOTE — Progress Notes (Cosign Needed)
Patient presented to the office for Abilify maintenna 400 injection given by Lupita Leash with no complaints . Pt tolerated injection well and will return in 28  days

## 2023-05-31 ENCOUNTER — Encounter (HOSPITAL_COMMUNITY): Payer: MEDICAID | Admitting: Psychiatry

## 2023-05-31 ENCOUNTER — Ambulatory Visit (INDEPENDENT_AMBULATORY_CARE_PROVIDER_SITE_OTHER): Payer: MEDICAID | Admitting: Psychiatry

## 2023-05-31 ENCOUNTER — Encounter (HOSPITAL_COMMUNITY): Payer: Self-pay | Admitting: Psychiatry

## 2023-05-31 DIAGNOSIS — F310 Bipolar disorder, current episode hypomanic: Secondary | ICD-10-CM

## 2023-05-31 MED ORDER — ABILIFY MAINTENA 400 MG IM PRSY: 400.0000 mg | PREFILLED_SYRINGE | INTRAMUSCULAR | 11 refills | Status: DC

## 2023-05-31 NOTE — Progress Notes (Signed)
BH MD/PA/NP OP Progress Note Virtual Visit via Video Note  I connected with Rebecca Atkins on 05/31/23 at  1:30 PM EDT by a video enabled telemedicine application and verified that I am speaking with the correct person using two identifiers.  Location: Patient: Home Provider: Clinic   I discussed the limitations of evaluation and management by telemedicine and the availability of in person appointments. The patient expressed understanding and agreed to proceed.  I provided 30 minutes of non-face-to-face time during this encounter.   05/31/2023 3:23 PM Rebecca Atkins  MRN:  [id number]  Chief Complaint: "The crackling noises better"  HPI: 57 year old female seen today for follow up psychiatric evaluation.  She has a psychiatric history of bipolar 1 disorder.  She is currently managed on Abilify Maintena 400 mg monthly. She notes that her medications are effective in managing her psychiatric  conditions.     Today she was well-groomed, pleasant, cooperative, and engaged in conversation.  Patient is reports that she is doing well. She notes that she is spending time with her family. She reports that the crackling noise in her head has diminished. She notes that she believes the reduction in noise has to do with her Abilify and her hypertension medications.  Patient notes that her mood is stable.  She also notes that her anxiety and depression are well-managed.  Today provider conducted a GAD-7 and patient scored a 0, at her last visit she scored a 0.  Provider also conducted PHQ-9 and patient scored a 0, at her last visit she scored a 0.  She endorses adequate sleep and appetite.  Today she denies SI/HI/VAH, mania, paranoia.  Patients blood pressure elevated today at 174/95. She reports that she will see her PCP in a few weeks and request medication adjustments.   Today provider conducted an aims assessment patient scored a 0.  No medication changes made today.  Patient agreeable to continue  medication as prescribed.  No other concerns noted at this time. Visit Diagnosis:    ICD-10-CM   1. Bipolar affective disorder, current episode hypomanic (HCC)  F31.0 ARIPiprazole ER (ABILIFY MAINTENA) 400 MG PRSY prefilled syringe      Past Psychiatric History: Bipolar disorder     Past Medical History:  Past Medical History:  Diagnosis Date   Bipolar disorder with severe mania (HCC)    Diabetes mellitus without complication (HCC)    Hyperlipidemia    Hypertension    Noncompliance     Past Surgical History:  Procedure Laterality Date   NO PAST SURGERIES      Family Psychiatric History: Denies  Family History:  Family History  Problem Relation Age of Onset   Diabetes Mother    Hypertension Mother    Diabetes Father    Hypertension Father     Social History:  Social History   Socioeconomic History   Marital status: Single    Spouse name: Not on file   Number of children: Not on file   Years of education: Not on file   Highest education level: Not on file  Occupational History   Not on file  Tobacco Use   Smoking status: Never   Smokeless tobacco: Never  Substance and Sexual Activity   Alcohol use: Not Currently   Drug use: Not Currently   Sexual activity: Not Currently  Other Topics Concern   Not on file  Social History Narrative   Not on file   Social Determinants of Health   Financial Resource  Strain: Not on file  Food Insecurity: Not on file  Transportation Needs: Not on file  Physical Activity: Not on file  Stress: Not on file  Social Connections: Not on file    Allergies:  Allergies  Allergen Reactions   Ibuprofen Rash    Metabolic Disorder Labs: Lab Results  Component Value Date   HGBA1C 14.2 (A) 02/11/2022   No results found for: "PROLACTIN" Lab Results  Component Value Date   CHOL 278 (H) 02/11/2022   TRIG 113 02/11/2022   HDL 80 02/11/2022   CHOLHDL 3.5 02/11/2022   LDLCALC 179 (H) 02/11/2022   LDLCALC 93 06/03/2021   No  results found for: "TSH"  Therapeutic Level Labs: No results found for: "LITHIUM" No results found for: "VALPROATE" No results found for: "CBMZ"  Current Medications: Current Outpatient Medications  Medication Sig Dispense Refill   amLODipine (NORVASC) 10 MG tablet Take 1 tablet (10 mg total) by mouth daily. 90 tablet 1   ARIPiprazole ER (ABILIFY MAINTENA) 400 MG PRSY prefilled syringe Inject 400 mg into the muscle every 28 (twenty-eight) days. 1 each 11   ASPIRIN 81 PO Take by mouth.      atorvastatin (LIPITOR) 40 MG tablet TAKE 1 TABLET (40 MG TOTAL) BY MOUTH DAILY. 90 tablet 3   Blood Glucose Monitoring Suppl (TRUE METRIX METER) w/Device KIT Use as instructed. Check blood glucose level by fingerstick three times per day. 1 kit 0   insulin glargine (LANTUS SOLOSTAR) 100 UNIT/ML Solostar Pen INJECT 10 UNITS INTO THE SKIN DAILY. 3 mL 2   lisinopril (ZESTRIL) 40 MG tablet Take 1 tablet (40 mg total) by mouth daily. 90 tablet 2   metFORMIN (GLUCOPHAGE) 500 MG tablet Take 2 tablets (1,000 mg total) by mouth 2 (two) times daily with a meal. 360 tablet 1   No current facility-administered medications for this visit.     Musculoskeletal: Strength & Muscle Tone: within normal limits and telehealth visit Gait & Station: normal, telehealth visit Patient leans: N/A  Psychiatric Specialty Exam: Review of Systems  Blood pressure (!) 174/95, pulse 89, temperature 98.4 F (36.9 C), weight 170 lb 12.8 oz (77.5 kg), SpO2 99 %.Body mass index is 28.42 kg/m.  General Appearance: Well Groomed  Eye Contact:  Good  Speech:  Clear and Coherent and Normal Rate  Volume:  Normal  Mood:   Euthymic  Affect:  Appropriate and Congruent  Thought Process:  Coherent, Goal Directed, and Linear  Orientation:  Full (Time, Place, and Person)  Thought Content: WDL and Logical   Suicidal Thoughts:  No  Homicidal Thoughts:  No  Memory:  Immediate;   Good Recent;   Good Remote;   Good  Judgement:  Good   Insight:  Good  Psychomotor Activity:  Normal  Concentration:  Concentration: Good and Attention Span: Good  Recall:  Good  Fund of Knowledge: Good  Language: Good  Akathisia:  No  Handed:  Right  AIMS (if indicated): done, 0  Assets:  Communication Skills Desire for Improvement Financial Resources/Insurance Housing Physical Health Social Support  ADL's:  Intact  Cognition: WNL  Sleep:  Good   Screenings: AIMS    Flowsheet Row Clinical Support from 05/31/2023 in [facility]  AIMS Total Score 0      GAD-7    Flowsheet Row Clinical Support from 05/31/2023 in [facility] Video Visit from 04/21/2023 in [facility] Office Visit from 04/06/2023 in [facility] Office Visit from 02/11/2022 in [facility] Office Visit from 09/03/2021 in [facility]  Total GAD-7 Score 0 0 0 0 0      PHQ2-9    Flowsheet Row Clinical Support from 05/31/2023 in [facility] Video Visit from 04/21/2023 in [facility] Office Visit from 04/06/2023 in [facility] Office Visit from 02/11/2022 in [facility] Office Visit from 09/03/2021 in [facility]  PHQ-2 Total Score 0 0 0 0 0  PHQ-9 Total Score 0 0 0 0 0        Assessment and Plan: Patient informed writer that her mood is more stable.  She denies symptoms of anxiety, depression, or psychosis.  Today no medication changes made today. Patient agreeable to continue medications as prescribed.   1. Bipolar affective disorder, current episode hypomanic (HCC)  Start- ARIPiprazole ER (ABILIFY MAINTENA) 400 MG prefilled syringe 400 mg Stat- ARIPiprazole ER (ABILIFY MAINTENA) 400 MG PRSY prefilled syringe; Inject 400 mg into the  muscle every 28 (twenty-eight) days.  Dispense: 1 each; Refill: 11  Collaboration of Care: Collaboration of Care: Other provider involved in patient's care AEB PCP and shot clinic staff  Patient/Guardian was advised Release of Information must be obtained prior to any record release in order to collaborate their care with an outside provider. Patient/Guardian was advised if they have not already done so to contact the registration department to sign all necessary forms in order for [place] to release information regarding their care.   Consent: Patient/Guardian gives verbal consent for treatment and assignment of benefits for services provided during this visit. Patient/Guardian expressed understanding and agreed to proceed.   Follow up in 1.5 months Follow up with shot clinic Shanna Cisco, NP 05/31/2023, 3:23 PM  Unable to

## 2023-06-02 ENCOUNTER — Encounter (HOSPITAL_COMMUNITY): Payer: Self-pay

## 2023-06-02 ENCOUNTER — Ambulatory Visit (INDEPENDENT_AMBULATORY_CARE_PROVIDER_SITE_OTHER): Payer: MEDICAID | Admitting: *Deleted

## 2023-06-02 VITALS — BP 183/81 | HR 87 | Resp 16 | Ht 65.0 in | Wt 167.4 lb

## 2023-06-02 DIAGNOSIS — F200 Paranoid schizophrenia: Secondary | ICD-10-CM

## 2023-06-02 MED ORDER — ARIPIPRAZOLE ER 400 MG IM PRSY
400.0000 mg | PREFILLED_SYRINGE | Freq: Once | INTRAMUSCULAR | Status: AC
Start: 2023-06-02 — End: 2023-06-02
  Administered 2023-06-02: 400 mg via INTRAMUSCULAR

## 2023-06-02 NOTE — Progress Notes (Cosign Needed)
Patient arrived with her Abilify Maintena 400mg  from her pharmacy. Given in Left Deltoid without issues or complaints. States that this is her 2nd injection and it has been doing well for her so far. No side effects. Pleasant, cooperative, with bright affect.

## 2023-06-30 ENCOUNTER — Ambulatory Visit (HOSPITAL_COMMUNITY): Payer: MEDICAID

## 2023-07-08 ENCOUNTER — Encounter (HOSPITAL_COMMUNITY): Payer: Self-pay

## 2023-07-08 ENCOUNTER — Ambulatory Visit (INDEPENDENT_AMBULATORY_CARE_PROVIDER_SITE_OTHER): Payer: MEDICAID

## 2023-07-08 VITALS — BP 158/88 | HR 66 | Ht 65.0 in | Wt 165.6 lb

## 2023-07-08 DIAGNOSIS — F200 Paranoid schizophrenia: Secondary | ICD-10-CM | POA: Diagnosis not present

## 2023-07-08 MED ORDER — ARIPIPRAZOLE ER 400 MG IM PRSY
400.0000 mg | PREFILLED_SYRINGE | Freq: Once | INTRAMUSCULAR | Status: AC
Start: 2023-07-08 — End: 2023-07-08
  Administered 2023-07-08: 400 mg via INTRAMUSCULAR

## 2023-07-08 NOTE — Progress Notes (Cosign Needed)
Patient arrived with Abilify Maintena 400mg  from her pharmacy. States medication is working well. No issues or complaints. States she had COVID recently but tested negative 2 days ago. Feels much better. Asked that injection be given in her Left Deltoid. Given as requested.

## 2023-07-13 ENCOUNTER — Ambulatory Visit (INDEPENDENT_AMBULATORY_CARE_PROVIDER_SITE_OTHER): Payer: MEDICAID | Admitting: Psychiatry

## 2023-07-13 ENCOUNTER — Encounter (HOSPITAL_COMMUNITY): Payer: Self-pay | Admitting: Psychiatry

## 2023-07-13 VITALS — BP 174/84 | HR 76 | Temp 97.9°F | Wt 169.4 lb

## 2023-07-13 DIAGNOSIS — R519 Headache, unspecified: Secondary | ICD-10-CM

## 2023-07-13 DIAGNOSIS — F310 Bipolar disorder, current episode hypomanic: Secondary | ICD-10-CM

## 2023-07-13 MED ORDER — ABILIFY MAINTENA 400 MG IM PRSY: 400.0000 mg | PREFILLED_SYRINGE | INTRAMUSCULAR | 11 refills | Status: DC

## 2023-07-13 NOTE — Progress Notes (Signed)
BH MD/PA/NP OP Progress Note Virtual Visit via Video Note  I connected with Rebecca Atkins on 07/13/23 at 11:00 AM EDT by a video enabled telemedicine application and verified that I am speaking with the correct person using two identifiers.  Location: Patient: Home Provider: Clinic   I discussed the limitations of evaluation and management by telemedicine and the availability of in person appointments. The patient expressed understanding and agreed to proceed.  I provided 30 minutes of non-face-to-face time during this encounter.   07/13/2023 11:27 AM Rebecca Atkins  MRN:  [id number]  Chief Complaint: "I have cracking and jaw movements"  HPI: 57 year old female seen today for follow up psychiatric evaluation.  She has a psychiatric history of bipolar 1 disorder.  She is currently managed on Abilify Maintena 400 mg monthly. She notes that her medications are effective in managing her psychiatric  conditions.     Today she was well-groomed, pleasant, cooperative, and engaged in conversation.  Patient is reports that she is doing well however notes that she has crackling in her head and jaw movements.  Provider witnessed one of these episodes.  Patient informed writer that her head had crackling sensations.  Patient's jaw clenched down for a few seconds and then she had repetitive jaw movements. She notes that she had minimal pain during this episode. She reports in the past she would have excruciating headaches but notes that he is not there less painful when they occur.  This episode lasted for about a minute.  Patient denies one-sided weakness, confusion, chest pain, or numbness in her arms.  Provider did conduct an aims assessment and patient scored a 0.  Provider informed patient that she will be referred to neurology for further assessment.  She endorsed understanding and agreed.    Since her last visit she notes that her anxiety and depression continues to be minimal.  Provider conducted a  GAD-7 and patient scored a 0, at her last visit she scored a 0. Provider also conducted PHQ-9 and patient scored a 0, at her last visit she scored a 0.  She endorses adequate sleep and appetite.  Today she denies SI/HI/VAH, mania, paranoia.  Patients blood pressure elevated today at 174/84. She reports that she will see her PCP regularly and takes her hypertensive medication daily.    No medication changes made today.  Patient agreeable to continue medication as prescribed.  She was referred to neurology for further evaluation.  No other concerns noted at this time. Visit Diagnosis:    ICD-10-CM   1. Acute nonintractable headache, unspecified headache type  R51.9 Ambulatory referral to Neurology    2. Bipolar affective disorder, current episode hypomanic (HCC)  F31.0 ARIPiprazole ER (ABILIFY MAINTENA) 400 MG PRSY prefilled syringe       Past Psychiatric History: Bipolar disorder     Past Medical History:  Past Medical History:  Diagnosis Date   Bipolar disorder with severe mania (HCC)    Diabetes mellitus without complication (HCC)    Hyperlipidemia    Hypertension    Noncompliance     Past Surgical History:  Procedure Laterality Date   NO PAST SURGERIES      Family Psychiatric History: Denies  Family History:  Family History  Problem Relation Age of Onset   Diabetes Mother    Hypertension Mother    Diabetes Father    Hypertension Father     Social History:  Social History   Socioeconomic History   Marital status: Single  Spouse name: Not on file   Number of children: Not on file   Years of education: Not on file   Highest education level: Not on file  Occupational History   Not on file  Tobacco Use   Smoking status: Never   Smokeless tobacco: Never  Substance and Sexual Activity   Alcohol use: Not Currently   Drug use: Not Currently   Sexual activity: Not Currently  Other Topics Concern   Not on file  Social History Narrative   Not on file   Social  Determinants of Health   Financial Resource Strain: Not on file  Food Insecurity: Not on file  Transportation Needs: Not on file  Physical Activity: Not on file  Stress: Not on file  Social Connections: Not on file    Allergies:  Allergies  Allergen Reactions   Ibuprofen Rash    Metabolic Disorder Labs: Lab Results  Component Value Date   HGBA1C 14.2 (A) 02/11/2022   No results found for: "PROLACTIN" Lab Results  Component Value Date   CHOL 278 (H) 02/11/2022   TRIG 113 02/11/2022   HDL 80 02/11/2022   CHOLHDL 3.5 02/11/2022   LDLCALC 179 (H) 02/11/2022   LDLCALC 93 06/03/2021   No results found for: "TSH"  Therapeutic Level Labs: No results found for: "LITHIUM" No results found for: "VALPROATE" No results found for: "CBMZ"  Current Medications: Current Outpatient Medications  Medication Sig Dispense Refill   amLODipine (NORVASC) 10 MG tablet Take 1 tablet (10 mg total) by mouth daily. 90 tablet 1   ARIPiprazole ER (ABILIFY MAINTENA) 400 MG PRSY prefilled syringe Inject 400 mg into the muscle every 28 (twenty-eight) days. 1 each 11   ASPIRIN 81 PO Take by mouth.      atorvastatin (LIPITOR) 40 MG tablet TAKE 1 TABLET (40 MG TOTAL) BY MOUTH DAILY. 90 tablet 3   Blood Glucose Monitoring Suppl (TRUE METRIX METER) w/Device KIT Use as instructed. Check blood glucose level by fingerstick three times per day. 1 kit 0   insulin glargine (LANTUS SOLOSTAR) 100 UNIT/ML Solostar Pen INJECT 10 UNITS INTO THE SKIN DAILY. 3 mL 2   lisinopril (ZESTRIL) 40 MG tablet Take 1 tablet (40 mg total) by mouth daily. 90 tablet 2   metFORMIN (GLUCOPHAGE) 500 MG tablet Take 2 tablets (1,000 mg total) by mouth 2 (two) times daily with a meal. 360 tablet 1   No current facility-administered medications for this visit.     Musculoskeletal: Strength & Muscle Tone: within normal limits and telehealth visit Gait & Station: normal, telehealth visit Patient leans: N/A  Psychiatric Specialty  Exam: Review of Systems  There were no vitals taken for this visit.There is no height or weight on file to calculate BMI.  General Appearance: Well Groomed  Eye Contact:  Good  Speech:  Clear and Coherent and Normal Rate  Volume:  Normal  Mood:   Euthymic  Affect:  Appropriate and Congruent  Thought Process:  Coherent, Goal Directed, and Linear  Orientation:  Full (Time, Place, and Person)  Thought Content: WDL and Logical   Suicidal Thoughts:  No  Homicidal Thoughts:  No  Memory:  Immediate;   Good Recent;   Good Remote;   Good  Judgement:  Good  Insight:  Good  Psychomotor Activity:  Normal  Concentration:  Concentration: Good and Attention Span: Good  Recall:  Good  Fund of Knowledge: Good  Language: Good  Akathisia:  No  Handed:  Right  AIMS (if  indicated): done, 0  Assets:  Communication Skills Desire for Improvement Financial Resources/Insurance Housing Physical Health Social Support  ADL's:  Intact  Cognition: WNL  Sleep:  Good   Screenings: AIMS    Flowsheet Row Clinical Support from 05/31/2023 in [facility]  AIMS Total Score 0      GAD-7    Flowsheet Row Clinical Support from 05/31/2023 in [facility] Video Visit from 04/21/2023 in [facility] Office Visit from 04/06/2023 in [facility] Office Visit from 02/11/2022 in [facility] Office Visit from 09/03/2021 in [facility]  Total GAD-7 Score 0 0 0 0 0      PHQ2-9    Flowsheet Row Clinical Support from 05/31/2023 in [facility] Video Visit from 04/21/2023 in [facility] Office Visit from 04/06/2023 in [facility] Office Visit from 02/11/2022 in [facility] Office Visit from 09/03/2021 in [facility]  PHQ-2 Total Score 0 0 0 0 0  PHQ-9 Total Score 0 0 0 0 0        Assessment and Plan: Patient informed writer that her mood is more stable.  She denies symptoms of anxiety, depression, or psychosis.  She does complain crackling in her head, slight headache, and clenching of her jaw that happens periodically. No medication changes made today.  Patient agreeable to continue medication as prescribed.  She was referred to neurology for further evaluation.   1. Bipolar affective disorder, current episode hypomanic (HCC)  Continue- ARIPiprazole ER (ABILIFY MAINTENA) 400 MG PRSY prefilled syringe; Inject 400 mg into the muscle every 28 (twenty-eight) days.  Dispense: 1 each; Refill: 11  2. Acute nonintractable headache, unspecified headache type  - Ambulatory referral to Neurology   Collaboration of Care: Collaboration of Care: Other provider involved in patient's care AEB PCP and shot clinic staff  Patient/Guardian was advised Release of Information must be obtained prior to any record release in order to collaborate their care with an outside provider. Patient/Guardian was advised if they have not already done so to contact the registration department to sign all necessary forms in order for [place] to release information regarding their care.   Consent: Patient/Guardian gives verbal consent for treatment and assignment of benefits for services provided during this visit. Patient/Guardian expressed understanding and agreed to proceed.   Follow up in 3 months Follow up with shot clinic Shanna Cisco, NP 07/13/2023, 11:27 AM  Unable to

## 2023-08-05 ENCOUNTER — Encounter (HOSPITAL_COMMUNITY): Payer: Self-pay

## 2023-08-05 ENCOUNTER — Ambulatory Visit (INDEPENDENT_AMBULATORY_CARE_PROVIDER_SITE_OTHER): Payer: MEDICAID

## 2023-08-05 VITALS — BP 160/87 | HR 90 | Resp 90 | Ht 65.0 in | Wt 167.2 lb

## 2023-08-05 DIAGNOSIS — F200 Paranoid schizophrenia: Secondary | ICD-10-CM | POA: Diagnosis not present

## 2023-08-05 DIAGNOSIS — G4700 Insomnia, unspecified: Secondary | ICD-10-CM

## 2023-08-05 DIAGNOSIS — F411 Generalized anxiety disorder: Secondary | ICD-10-CM

## 2023-08-05 MED ORDER — ARIPIPRAZOLE ER 400 MG IM PRSY
400.0000 mg | PREFILLED_SYRINGE | Freq: Once | INTRAMUSCULAR | Status: AC
  Administered 2023-08-05: 400 mg via INTRAMUSCULAR

## 2023-08-05 NOTE — Progress Notes (Cosign Needed)
  Patient presented to the office for Abilify maintenna 400 injection given by Lupita Leash in the LEFT deltoid  with no complaints . Pt tolerated injection well and will return in 28  days

## 2023-09-02 ENCOUNTER — Ambulatory Visit (INDEPENDENT_AMBULATORY_CARE_PROVIDER_SITE_OTHER): Payer: Medicaid Other

## 2023-09-02 ENCOUNTER — Encounter (HOSPITAL_COMMUNITY): Payer: Self-pay

## 2023-09-02 VITALS — BP 147/81 | HR 83 | Ht 65.0 in | Wt 162.0 lb

## 2023-09-02 DIAGNOSIS — F200 Paranoid schizophrenia: Secondary | ICD-10-CM

## 2023-09-02 MED ORDER — ARIPIPRAZOLE ER 400 MG IM PRSY
400.0000 mg | PREFILLED_SYRINGE | Freq: Once | INTRAMUSCULAR | Status: AC
Start: 2023-09-02 — End: 2023-09-02
  Administered 2023-09-02: 400 mg via INTRAMUSCULAR

## 2023-09-02 NOTE — Progress Notes (Cosign Needed)
Patient presented to the office for Abilify maintenna 400 injection given by Lupita Leash with no complaints . Pt tolerated injection well and will return in 28  days

## 2023-09-08 ENCOUNTER — Telehealth (HOSPITAL_COMMUNITY): Payer: Self-pay | Admitting: Psychiatry

## 2023-09-09 NOTE — Telephone Encounter (Signed)
Patient informed Clinical research associate that she is concerned that her primary care doctor will not accept Medicaid is relieved.  She request that provider release the information about her mental health if her PCP called.  Provider endorsed understanding and agreed to notify patient that she would need to sign a release of information form.  She endorsed understanding and agreement.  No other concerns noted at this time.

## 2023-09-20 ENCOUNTER — Telehealth: Payer: Self-pay | Admitting: Diagnostic Neuroimaging

## 2023-09-20 NOTE — Telephone Encounter (Signed)
LVM and sent mychart msg informing pt of need to reschedule 10/19/23 appt - MD out

## 2023-09-29 ENCOUNTER — Encounter: Payer: Self-pay | Admitting: Diagnostic Neuroimaging

## 2023-09-29 ENCOUNTER — Ambulatory Visit: Payer: MEDICAID | Admitting: Diagnostic Neuroimaging

## 2023-09-29 VITALS — BP 136/81 | HR 83 | Ht 66.0 in | Wt 160.6 lb

## 2023-09-29 DIAGNOSIS — R252 Cramp and spasm: Secondary | ICD-10-CM

## 2023-09-29 DIAGNOSIS — H9319 Tinnitus, unspecified ear: Secondary | ICD-10-CM

## 2023-09-29 NOTE — Patient Instructions (Signed)
  INTERMITTENT CRACKING SOUND SENSATIONS IN HEAD (since ~ 2000; brief, less than a second duration, no pain; may occur every 1-2 months or less) - unclear etiology; CT head in 2007 was negative - recommend to monitor for now; no medications advised at this time
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
## 2023-09-30 ENCOUNTER — Ambulatory Visit (INDEPENDENT_AMBULATORY_CARE_PROVIDER_SITE_OTHER): Payer: MEDICAID | Admitting: Psychiatry

## 2023-09-30 ENCOUNTER — Encounter (HOSPITAL_COMMUNITY): Payer: Self-pay

## 2023-09-30 VITALS — BP 142/97 | HR 88 | Temp 98.1°F | Wt 162.0 lb

## 2023-09-30 DIAGNOSIS — F200 Paranoid schizophrenia: Secondary | ICD-10-CM | POA: Insufficient documentation

## 2023-09-30 DIAGNOSIS — F310 Bipolar disorder, current episode hypomanic: Secondary | ICD-10-CM | POA: Diagnosis not present

## 2023-09-30 MED ORDER — ARIPIPRAZOLE ER 300 MG IM SRER
300.0000 mg | INTRAMUSCULAR | Status: DC
  Administered 2023-10-28 – 2024-02-23 (×5): 300 mg via INTRAMUSCULAR

## 2023-09-30 MED ORDER — ARIPIPRAZOLE ER 300 MG IM PRSY: 300.0000 mg | PREFILLED_SYRINGE | INTRAMUSCULAR | 11 refills | Status: DC

## 2023-09-30 MED ORDER — ARIPIPRAZOLE ER 400 MG IM PRSY
400.0000 mg | PREFILLED_SYRINGE | Freq: Once | INTRAMUSCULAR | Status: AC
  Administered 2023-09-30: 400 mg via INTRAMUSCULAR

## 2023-09-30 NOTE — Progress Notes (Cosign Needed)
In as scheduled for her monthly inj of Abilify M 400 mg given today in her L deltoid without issue. She also had a consult with Dr Doyne Keel as is required every three months. Plan at next visit to have Abilify reduced to 300 mg per patients request and Dr Doyne Keel is in agreement. Father brought her to her appt. She is pleasant and appropriate and to this writer offers no complaints.

## 2023-09-30 NOTE — Progress Notes (Signed)
BH MD/PA/NP OP Progress Note Virtual Visit via Video Note  I connected with Rebecca Atkins on 09/30/23 at  9:30 AM EST by a video enabled telemedicine application and verified that I am speaking with the correct person using two identifiers.  Location: Patient: Home Provider: Clinic   I discussed the limitations of evaluation and management by telemedicine and the availability of in person appointments. The patient expressed understanding and agreed to proceed.  I provided 30 minutes of non-face-to-face time during this encounter.   09/30/2023 9:37 AM SPRUHA WEIGHT  MRN:  [id number]  Chief Complaint: "I would like my medication reduced"  HPI: 57 year old female seen today for follow up psychiatric evaluation.  She has a psychiatric history of bipolar 1 disorder.  She is currently managed on Abilify Maintena 400 mg monthly. She notes that her medications are effective in managing her psychiatric  conditions.     Today she was well-groomed, pleasant, cooperative, and engaged in conversation.  Patient reports that she is doing well however notes that she would like her medication reduced. Patient notes that she believes its to strong for her body. She reports that at times she feels a crackling sensations in her head but notes that it improved since her last visit. Patients saw neurology on 09/29/2023.  Per chart review she had a head CT in 2007 which was negative. The provider recommended monitoring  for now; no medications advised at this time and to return to referring provider, pending if symptoms worsen or fail to improve.  Since her last visit she informed writer that her anxiety and depression are well-managed.rovider conducted a GAD-7 and patient scored a 0, at her last visit she scored a 0. Provider also conducted PHQ-9 and patient scored a 0, at her last visit she scored a 0.  She endorses adequate sleep and appetite.  Today she denies SI/HI/AVH, mania, or paranoia.  Today patient received  Abilify Maintena 400 mg.  At her next visit the dose will be reduced to 300 mg. Potential side effects of medication and risks vs benefits of treatment vs non-treatment were explained and discussed. All questions were answered.  No other concerns noted at this time. Visit Diagnosis:    ICD-10-CM   1. Schizophrenia, paranoid (HCC)  F20.0 ARIPiprazole ER (ABILIFY MAINTENA) injection 300 mg    ARIPiprazole ER (ABILIFY MAINTENA) 300 MG PRSY prefilled syringe        Past Psychiatric History: Bipolar disorder     Past Medical History:  Past Medical History:  Diagnosis Date  . Bipolar disorder with severe mania (HCC)   . Diabetes mellitus without complication (HCC)   . Hyperlipidemia   . Hypertension   . Noncompliance     Past Surgical History:  Procedure Laterality Date  . NO PAST SURGERIES      Family Psychiatric History: Denies  Family History:  Family History  Problem Relation Age of Onset  . Diabetes Mother   . Hypertension Mother   . Diabetes Father   . Hypertension Father     Social History:  Social History   Socioeconomic History  . Marital status: Single    Spouse name: Not on file  . Number of children: Not on file  . Years of education: Not on file  . Highest education level: Not on file  Occupational History  . Not on file  Tobacco Use  . Smoking status: Never  . Smokeless tobacco: Never  Substance and Sexual Activity  . Alcohol use:  Not Currently  . Drug use: Not Currently  . Sexual activity: Not Currently  Other Topics Concern  . Not on file  Social History Narrative  . Not on file   Social Determinants of Health   Financial Resource Strain: Not on file  Food Insecurity: Not on file  Transportation Needs: Not on file  Physical Activity: Not on file  Stress: Not on file  Social Connections: Unknown (07/15/2023)   [facility]   Social Network   . Social Network: Not on file    Allergies:  Allergies  Allergen Reactions  .  Ibuprofen Rash    Metabolic Disorder Labs: Lab Results  Component Value Date   HGBA1C 14.2 (A) 02/11/2022   No results found for: "PROLACTIN" Lab Results  Component Value Date   CHOL 278 (H) 02/11/2022   TRIG 113 02/11/2022   HDL 80 02/11/2022   CHOLHDL 3.5 02/11/2022   LDLCALC 179 (H) 02/11/2022   LDLCALC 93 06/03/2021   No results found for: "TSH"  Therapeutic Level Labs: No results found for: "LITHIUM" No results found for: "VALPROATE" No results found for: "CBMZ"  Current Medications: Current Outpatient Medications  Medication Sig Dispense Refill  . ARIPiprazole ER (ABILIFY MAINTENA) 300 MG PRSY prefilled syringe Inject 300 mg into the muscle every 28 (twenty-eight) days. 1 each 11  . amLODipine (NORVASC) 10 MG tablet Take 1 tablet (10 mg total) by mouth daily. 90 tablet 1  . ARIPiprazole ER (ABILIFY MAINTENA) 400 MG PRSY prefilled syringe Inject 400 mg into the muscle every 28 (twenty-eight) days. 1 each 11  . ASPIRIN 81 PO Take by mouth.     Marland Kitchen atorvastatin (LIPITOR) 40 MG tablet TAKE 1 TABLET (40 MG TOTAL) BY MOUTH DAILY. 90 tablet 3  . Blood Glucose Monitoring Suppl (TRUE METRIX METER) w/Device KIT Use as instructed. Check blood glucose level by fingerstick three times per day. 1 kit 0  . dapagliflozin propanediol (FARXIGA) 10 MG TABS tablet Take 10 mg by mouth daily.    . insulin glargine (LANTUS SOLOSTAR) 100 UNIT/ML Solostar Pen INJECT 10 UNITS INTO THE SKIN DAILY. 3 mL 2  . lisinopril (ZESTRIL) 40 MG tablet Take 1 tablet (40 mg total) by mouth daily. 90 tablet 2  . metFORMIN (GLUCOPHAGE) 500 MG tablet Take 2 tablets (1,000 mg total) by mouth 2 (two) times daily with a meal. 360 tablet 1  . omeprazole (PRILOSEC) 40 MG capsule Take 40 mg by mouth daily.    Marland Kitchen OZEMPIC, 0.25 OR 0.5 MG/DOSE, 2 MG/3ML SOPN Inject 0.5 mg into the skin once a week.    . Vitamin D, Ergocalciferol, (DRISDOL) 1.25 MG (50000 UNIT) CAPS capsule Take 50,000 Units by mouth once a week.      Current Facility-Administered Medications  Medication Dose Route Frequency Provider Last Rate Last Admin  . ARIPiprazole ER (ABILIFY MAINTENA) injection 300 mg  300 mg Intramuscular Q28 days          Musculoskeletal: Strength & Muscle Tone: within normal limits and telehealth visit Gait & Station: normal, telehealth visit Patient leans: N/A  Psychiatric Specialty Exam: Review of Systems  Blood pressure (!) 142/97, pulse 88, temperature 98.1 F (36.7 C), weight 162 lb (73.5 kg), SpO2 99%.Body mass index is 26.15 kg/m.  General Appearance: Well Groomed  Eye Contact:  Good  Speech:  Clear and Coherent and Normal Rate  Volume:  Normal  Mood:   Euthymic  Affect:  Appropriate and Congruent  Thought Process:  Coherent, Goal  Directed, and Linear  Orientation:  Full (Time, Place, and Person)  Thought Content: WDL and Logical   Suicidal Thoughts:  No  Homicidal Thoughts:  No  Memory:  Immediate;   Good Recent;   Good Remote;   Good  Judgement:  Good  Insight:  Good  Psychomotor Activity:  Normal  Concentration:  Concentration: Good and Attention Span: Good  Recall:  Good  Fund of Knowledge: Good  Language: Good  Akathisia:  No  Handed:  Right  AIMS (if indicated): done, 0  Assets:  Communication Skills Desire for Improvement Financial Resources/Insurance Housing Physical Health Social Support  ADL's:  Intact  Cognition: WNL  Sleep:  Good   Screenings: AIMS    Flowsheet Row Clinical Support from 05/31/2023 in [facility]  AIMS Total Score 0      GAD-7    Flowsheet Row Office Visit from 09/30/2023 in [facility] Clinical Support from 05/31/2023 in [facility] Video Visit from 04/21/2023 in [facility] Office Visit from 04/06/2023 in [facility] Office Visit from 02/11/2022 in [facility] [place] [facility] Of [place].  [facility]  Total GAD-7 Score 0 0 0 0 0      PHQ2-9    Flowsheet Row Office Visit from 09/30/2023 in [facility] Clinical Support from 05/31/2023 in [facility] Video Visit from 04/21/2023 in [facility] Office Visit from 04/06/2023 in [facility] Office Visit from 02/11/2022 in [facility] [place] [facility] Dept Of [place]. [facility]  PHQ-2 Total Score 0 0 0 0 0  PHQ-9 Total Score 0 0 0 0 0        Assessment and Plan: Patient informed writer that her mood is more stable.  She denies symptoms of anxiety, depression, or psychosis.  She requested that her medication be reduced. Today patient received Abilify Maintena 400 mg.  At her next visit the dose will be reduced to 300 mg.  Patient continues to have crackling sensations in her head however notes that it improves.  Per neurology's note her head CT was negative.  They recommended patient monitor for now and to follow-up if needed.    Collaboration of Care: Collaboration of Care: Other provider involved in patient's care AEB PCP and shot clinic staff  Patient/Guardian was advised Release of Information must be obtained prior to any record release in order to collaborate their care with an outside provider. Patient/Guardian was advised if they have not already done so to contact the registration department to sign all necessary forms in order for [place] to release information regarding their care.   Bipolar affective disorder, current episode hypomanic (HCC)  Reduced- ARIPiprazole ER (ABILIFY MAINTENA) injection 300 mg Reduced- ARIPiprazole ER (ABILIFY MAINTENA) 300 MG PRSY prefilled syringe; Inject 300 mg into the muscle every 28 (twenty-eight) days.  Dispense: 1 each; Refill: 11  Consent: Patient/Guardian gives verbal consent for treatment and assignment of benefits for services provided during this  visit. Patient/Guardian expressed understanding and agreed to proceed.   Follow up in 3 months Follow up with shot clinic Shanna Cisco, NP 09/30/2023, 9:37 AM  Unable to

## 2023-09-30 NOTE — Progress Notes (Signed)
In as scheduled for her monthly inj of Abilify M 400 mg given today in her L deltoid without issue. She also had a consult with Dr Doyne Keel as is required every three months. Plan at next visit to have Abilify reduced to 300 mg per patients request and Dr Doyne Keel is in agreement. Father brought her to her appt. She is pleasant and appropriate and to this writer offers no complaints.

## 2023-10-05 ENCOUNTER — Encounter (HOSPITAL_COMMUNITY): Payer: MEDICAID | Admitting: Psychiatry

## 2023-10-19 ENCOUNTER — Ambulatory Visit: Payer: Self-pay | Admitting: Diagnostic Neuroimaging

## 2023-10-28 ENCOUNTER — Ambulatory Visit (INDEPENDENT_AMBULATORY_CARE_PROVIDER_SITE_OTHER): Payer: MEDICAID

## 2023-10-28 ENCOUNTER — Encounter (HOSPITAL_COMMUNITY): Payer: Self-pay

## 2023-10-28 VITALS — BP 143/79 | HR 81 | Wt 161.6 lb

## 2023-10-28 DIAGNOSIS — F200 Paranoid schizophrenia: Secondary | ICD-10-CM

## 2023-10-28 DIAGNOSIS — F411 Generalized anxiety disorder: Secondary | ICD-10-CM

## 2023-10-28 DIAGNOSIS — G4700 Insomnia, unspecified: Secondary | ICD-10-CM

## 2023-10-28 DIAGNOSIS — F310 Bipolar disorder, current episode hypomanic: Secondary | ICD-10-CM | POA: Diagnosis not present

## 2023-10-28 NOTE — Progress Notes (Cosign Needed)
Patient presented to the office for Abilify maintenna 300 injection given by Lupita Leash with no complaints . Pt tolerated injection well and will return in 28 days injection was given in the left arm.

## 2023-11-19 ENCOUNTER — Telehealth (HOSPITAL_COMMUNITY): Payer: Self-pay | Admitting: Psychiatry

## 2023-11-22 ENCOUNTER — Other Ambulatory Visit (HOSPITAL_COMMUNITY): Payer: Self-pay

## 2023-11-22 ENCOUNTER — Other Ambulatory Visit: Payer: Self-pay

## 2023-11-22 ENCOUNTER — Encounter (HOSPITAL_COMMUNITY): Payer: Self-pay

## 2023-11-22 ENCOUNTER — Telehealth (HOSPITAL_COMMUNITY): Payer: Self-pay

## 2023-11-22 ENCOUNTER — Other Ambulatory Visit (HOSPITAL_COMMUNITY): Payer: Self-pay | Admitting: Psychiatry

## 2023-11-22 DIAGNOSIS — F310 Bipolar disorder, current episode hypomanic: Secondary | ICD-10-CM

## 2023-11-22 MED ORDER — ARIPIPRAZOLE ER 300 MG IM PRSY
300.0000 mg | PREFILLED_SYRINGE | INTRAMUSCULAR | 11 refills | Status: DC
  Filled 2023-11-22 (×2): qty 1, 28d supply, fill #0
  Filled 2023-12-10: qty 1, 28d supply, fill #1

## 2023-11-22 NOTE — Telephone Encounter (Signed)
Request sent 

## 2023-11-22 NOTE — Progress Notes (Signed)
Specialty Pharmacy Initial Fill Coordination Note  Rebecca Atkins is a 57 y.o. female contacted today regarding initial fill of specialty medication(s) ARIPiprazole (ABILIFY MAINTENA)   Patient requested Courier to Provider Office   Delivery date: 11/25/23   Verified address: [address] ([address])   Medication will be filled on 12/31.   Patient is aware of $4 copayment.

## 2023-11-22 NOTE — Telephone Encounter (Signed)
Abilify 300 mg sent to preferred pharmacy. At patients last visit it was discussed that her dose would be reduced from 400 mg to 300 mg.

## 2023-11-23 ENCOUNTER — Other Ambulatory Visit: Payer: Self-pay

## 2023-11-23 NOTE — Telephone Encounter (Signed)
Thanks you for this update.

## 2023-11-25 ENCOUNTER — Other Ambulatory Visit (HOSPITAL_COMMUNITY): Payer: Self-pay

## 2023-11-25 ENCOUNTER — Encounter (HOSPITAL_COMMUNITY): Payer: Self-pay

## 2023-11-25 ENCOUNTER — Ambulatory Visit (INDEPENDENT_AMBULATORY_CARE_PROVIDER_SITE_OTHER): Payer: MEDICAID

## 2023-11-25 DIAGNOSIS — F310 Bipolar disorder, current episode hypomanic: Secondary | ICD-10-CM | POA: Diagnosis not present

## 2023-11-25 NOTE — Progress Notes (Cosign Needed)
 Patient presented to the office for Abilify maintenna 300 injection given by Lupita Leash with no complaints . Pt tolerated injection well and will return in 28 days injection was given in the left arm.

## 2023-12-07 ENCOUNTER — Other Ambulatory Visit (HOSPITAL_COMMUNITY): Payer: Self-pay

## 2023-12-10 ENCOUNTER — Other Ambulatory Visit: Payer: Self-pay

## 2023-12-10 NOTE — Progress Notes (Signed)
Specialty Pharmacy Refill Coordination Note  Rebecca Atkins is a 58 y.o. female contacted today regarding refills of specialty medication(s) ARIPiprazole (ABILIFY MAINTENA)   Patient requested Courier to Provider Office   Delivery date: 12/16/23   Verified address: [address] ([address])   Medication will be filled on 01.22.25.

## 2023-12-23 ENCOUNTER — Other Ambulatory Visit: Payer: Self-pay

## 2023-12-23 ENCOUNTER — Encounter (HOSPITAL_COMMUNITY): Payer: Self-pay | Admitting: Psychiatry

## 2023-12-23 ENCOUNTER — Ambulatory Visit (INDEPENDENT_AMBULATORY_CARE_PROVIDER_SITE_OTHER): Payer: MEDICAID | Admitting: *Deleted

## 2023-12-23 ENCOUNTER — Encounter (HOSPITAL_COMMUNITY): Payer: Self-pay

## 2023-12-23 ENCOUNTER — Ambulatory Visit (INDEPENDENT_AMBULATORY_CARE_PROVIDER_SITE_OTHER): Payer: MEDICAID | Admitting: Psychiatry

## 2023-12-23 VITALS — BP 149/88 | HR 74 | Resp 16 | Ht 65.0 in | Wt 158.2 lb

## 2023-12-23 DIAGNOSIS — F310 Bipolar disorder, current episode hypomanic: Secondary | ICD-10-CM

## 2023-12-23 MED ORDER — ARIPIPRAZOLE ER 300 MG IM PRSY
300.0000 mg | PREFILLED_SYRINGE | INTRAMUSCULAR | 11 refills | Status: DC
  Filled 2023-12-23 – 2024-01-11 (×2): qty 1, 28d supply, fill #0
  Filled 2024-02-23: qty 1, 28d supply, fill #1
  Filled 2024-02-23 (×2): qty 1, 28d supply, fill #0

## 2023-12-23 NOTE — Progress Notes (Signed)
BH MD/PA/NP OP Progress Note    12/23/2023 9:57 AM Rebecca Atkins  MRN:  [id number]  Chief Complaint: "I would like my medication reduced"  HPI: 58 year old female seen today for follow up psychiatric evaluation.  She has a psychiatric history of schizophrenia and bipolar 1 disorder.  She is currently managed on Abilify Maintena 300 mg monthly. She notes that her medications are effective in managing her psychiatric  conditions.     Today she was well-groomed, pleasant, cooperative, and engaged in conversation.  Patient reports that she is doing well.  She informed Clinical research associate that her mood is stable and denies symptoms of anxiety and depression.  Patient does note that she has been sleeping more than she usually does.  She notes that when it is cold outside she sleeps more.  Today provider conducted a GAD-7 and patient scored a 0, at her last visit she scored a 0. Provider also conducted PHQ-9 and patient scored a 0, at her last visit she scored a 0.  She endorses adequate appetite.  Today she denies SI/HI/AVH, mania, or paranoia.  Today provider conducted an aims assessment and patient scored a 0.  No medication changes made today. Patient agreeable to continue medications as prescribes.  No other concerns noted at this time. Visit Diagnosis:  No diagnosis found.     Past Psychiatric History: Bipolar disorder     Past Medical History:  Past Medical History:  Diagnosis Date   Bipolar disorder with severe mania (HCC)    Diabetes mellitus without complication (HCC)    Hyperlipidemia    Hypertension    Noncompliance     Past Surgical History:  Procedure Laterality Date   NO PAST SURGERIES      Family Psychiatric History: Denies  Family History:  Family History  Problem Relation Age of Onset   Diabetes Mother    Hypertension Mother    Diabetes Father    Hypertension Father     Social History:  Social History   Socioeconomic History   Marital status: Single    Spouse name:  Not on file   Number of children: Not on file   Years of education: Not on file   Highest education level: Not on file  Occupational History   Not on file  Tobacco Use   Smoking status: Never   Smokeless tobacco: Never  Substance and Sexual Activity   Alcohol use: Not Currently   Drug use: Not Currently   Sexual activity: Not Currently  Other Topics Concern   Not on file  Social History Narrative   Not on file   Social Drivers of Health   Financial Resource Strain: Not on file  Food Insecurity: Not on file  Transportation Needs: Not on file  Physical Activity: Not on file  Stress: Not on file  Social Connections: Unknown (07/15/2023)   Received from [facility]   Social Network    Social Network: Not on file    Allergies:  Allergies  Allergen Reactions   Ibuprofen Rash    Metabolic Disorder Labs: Lab Results  Component Value Date   HGBA1C 14.2 (A) 02/11/2022   No results found for: "PROLACTIN" Lab Results  Component Value Date   CHOL 278 (H) 02/11/2022   TRIG 113 02/11/2022   HDL 80 02/11/2022   CHOLHDL 3.5 02/11/2022   LDLCALC 179 (H) 02/11/2022   LDLCALC 93 06/03/2021   No results found for: "TSH"  Therapeutic Level Labs: No results found for: "LITHIUM" No results  found for: "VALPROATE" No results found for: "CBMZ"  Current Medications: Current Outpatient Medications  Medication Sig Dispense Refill   amLODipine (NORVASC) 10 MG tablet Take 1 tablet (10 mg total) by mouth daily. 90 tablet 1   ARIPiprazole ER (ABILIFY MAINTENA) 300 MG PRSY prefilled syringe Inject 300 mg into the muscle every 28 (twenty-eight) days. 1 each 11   ASPIRIN 81 PO Take by mouth.      atorvastatin (LIPITOR) 40 MG tablet TAKE 1 TABLET (40 MG TOTAL) BY MOUTH DAILY. 90 tablet 3   Blood Glucose Monitoring Suppl (TRUE METRIX METER) w/Device KIT Use as instructed. Check blood glucose level by fingerstick three times per day. 1 kit 0   dapagliflozin propanediol (FARXIGA) 10 MG  TABS tablet Take 10 mg by mouth daily.     insulin glargine (LANTUS SOLOSTAR) 100 UNIT/ML Solostar Pen INJECT 10 UNITS INTO THE SKIN DAILY. 3 mL 2   lisinopril (ZESTRIL) 40 MG tablet Take 1 tablet (40 mg total) by mouth daily. 90 tablet 2   metFORMIN (GLUCOPHAGE) 500 MG tablet Take 2 tablets (1,000 mg total) by mouth 2 (two) times daily with a meal. 360 tablet 1   omeprazole (PRILOSEC) 40 MG capsule Take 40 mg by mouth daily.     OZEMPIC, 0.25 OR 0.5 MG/DOSE, 2 MG/3ML SOPN Inject 0.5 mg into the skin once a week.     Vitamin D, Ergocalciferol, (DRISDOL) 1.25 MG (50000 UNIT) CAPS capsule Take 50,000 Units by mouth once a week.     Current Facility-Administered Medications  Medication Dose Route Frequency Provider Last Rate Last Admin   ARIPiprazole ER (ABILIFY MAINTENA) injection 300 mg  300 mg Intramuscular Q28 days    300 mg at 12/23/23 8119     Musculoskeletal: Strength & Muscle Tone: within normal limits and telehealth visit Gait & Station: normal, telehealth visit Patient leans: N/A  Psychiatric Specialty Exam: Review of Systems  There were no vitals taken for this visit.There is no height or weight on file to calculate BMI.  General Appearance: Well Groomed  Eye Contact:  Good  Speech:  Clear and Coherent and Normal Rate  Volume:  Normal  Mood:   Euthymic  Affect:  Appropriate and Congruent  Thought Process:  Coherent, Goal Directed, and Linear  Orientation:  Full (Time, Place, and Person)  Thought Content: WDL and Logical   Suicidal Thoughts:  No  Homicidal Thoughts:  No  Memory:  Immediate;   Good Recent;   Good Remote;   Good  Judgement:  Good  Insight:  Good  Psychomotor Activity:  Normal  Concentration:  Concentration: Good and Attention Span: Good  Recall:  Good  Fund of Knowledge: Good  Language: Good  Akathisia:  No  Handed:  Right  AIMS (if indicated): done, 0  Assets:  Communication Skills Desire for Improvement Financial  Resources/Insurance Housing Physical Health Social Support  ADL's:  Intact  Cognition: WNL  Sleep:  Good   Screenings: AIMS    Flowsheet Row Clinical Support from 12/23/2023 in [facility] Clinical Support from 05/31/2023 in [facility]  AIMS Total Score 0 0      GAD-7    Flowsheet Row Clinical Support from 12/23/2023 in [facility] Office Visit from 09/30/2023 in [facility] Clinical Support from 05/31/2023 in [facility] Video Visit from 04/21/2023 in [facility] Office Visit from 04/06/2023 in [facility]  Total GAD-7 Score 0 0 0 0 0      PHQ2-9    Flowsheet Row Clinical Support from 12/23/2023 in [facility] Office Visit from 09/30/2023 in [facility] Clinical Support from 05/31/2023 in [facility] Video Visit from 04/21/2023 in [facility] Office Visit from 04/06/2023 in [facility]  PHQ-2 Total Score 0 0 0 0 0  PHQ-9 Total Score 0 0 0 0 0        Assessment and Plan: Patient informed writer that her mood is more stable.  She denies symptoms of anxiety, depression, or psychosis.  She requested that her medication be reduced. Today patient received Abilify Maintena 400 mg.  At her next visit the dose will be reduced to 300 mg.  Patient continues to have crackling sensations in her head however notes that it improves.  Per neurology's note her head CT was negative.  They recommended patient monitor for now and to follow-up if needed.    Collaboration of Care: Collaboration of Care: Other provider involved in patient's care AEB PCP and shot clinic staff  Patient/Guardian was advised Release of Information must be obtained prior to any record release  in order to collaborate their care with an outside provider. Patient/Guardian was advised if they have not already done so to contact the registration department to sign all necessary forms in order for [place] to release information regarding their care.   Bipolar affective disorder, current episode hypomanic (HCC)  Reduced- ARIPiprazole ER (ABILIFY MAINTENA) injection 300 mg Reduced- ARIPiprazole ER (ABILIFY MAINTENA) 300 MG PRSY prefilled syringe; Inject 300 mg into the muscle every 28 (twenty-eight) days.  Dispense: 1 each; Refill: 11  Consent: Patient/Guardian gives verbal consent for treatment and assignment of benefits for services provided during this visit. Patient/Guardian expressed understanding and agreed to proceed.   Follow up in 3 months Follow up with shot clinic Shanna Cisco, NP 12/23/2023, 9:57 AM  Unable to

## 2023-12-23 NOTE — Progress Notes (Cosign Needed)
Patient arrived for injection of Abilify Maintena 300mg  that was delivered by her pharmacy. States she is doing well, no issues with medication. Spoke with Dr Doyne Keel before injection was given for her scheduled appointment. Injection prepared as ordered and given in Left Deltoid per patient request. Denies SI/HI or AV hallucinations. No issues or complaints. Will return in 28 days.

## 2023-12-29 ENCOUNTER — Other Ambulatory Visit: Payer: Self-pay

## 2023-12-30 ENCOUNTER — Other Ambulatory Visit (HOSPITAL_COMMUNITY): Payer: Self-pay

## 2023-12-31 ENCOUNTER — Other Ambulatory Visit (HOSPITAL_COMMUNITY): Payer: Self-pay

## 2024-01-11 ENCOUNTER — Other Ambulatory Visit: Payer: Self-pay

## 2024-01-11 NOTE — Progress Notes (Signed)
Specialty Pharmacy Refill Coordination Note  Rebecca Atkins is a 58 y.o. female contacted today regarding refills of specialty medication(s) ARIPiprazole (ABILIFY MAINTENA)   Patient requested Courier to Provider Office   Delivery date: 01/17/24   Verified address: [address] ([address])   Medication will be filled on 01/14/24.

## 2024-01-20 ENCOUNTER — Ambulatory Visit (INDEPENDENT_AMBULATORY_CARE_PROVIDER_SITE_OTHER): Payer: MEDICAID

## 2024-01-20 VITALS — BP 135/89 | HR 90 | Ht 65.0 in | Wt 153.0 lb

## 2024-01-20 DIAGNOSIS — F310 Bipolar disorder, current episode hypomanic: Secondary | ICD-10-CM

## 2024-01-20 DIAGNOSIS — F200 Paranoid schizophrenia: Secondary | ICD-10-CM

## 2024-01-20 NOTE — Progress Notes (Cosign Needed)
 Pt presents today for injection of Abilify maintenna 300mg . This was administered in in her right deltoid with no complaints.   Pt states that she is doing well with no complaints. Pt denies any AVH, SI, and HI. Pt states that she in good sprirts and presents to be well groomed.

## 2024-02-17 ENCOUNTER — Ambulatory Visit (HOSPITAL_COMMUNITY): Payer: MEDICAID

## 2024-02-22 ENCOUNTER — Ambulatory Visit (HOSPITAL_COMMUNITY): Payer: MEDICAID

## 2024-02-23 ENCOUNTER — Other Ambulatory Visit (HOSPITAL_COMMUNITY): Payer: Self-pay

## 2024-02-23 ENCOUNTER — Ambulatory Visit (INDEPENDENT_AMBULATORY_CARE_PROVIDER_SITE_OTHER): Payer: MEDICAID | Admitting: Family

## 2024-02-23 ENCOUNTER — Other Ambulatory Visit: Payer: Self-pay

## 2024-02-23 ENCOUNTER — Ambulatory Visit (INDEPENDENT_AMBULATORY_CARE_PROVIDER_SITE_OTHER): Payer: MEDICAID | Admitting: *Deleted

## 2024-02-23 ENCOUNTER — Encounter (HOSPITAL_COMMUNITY): Payer: Self-pay

## 2024-02-23 VITALS — BP 152/84 | HR 86 | Resp 16 | Ht 65.0 in | Wt 160.2 lb

## 2024-02-23 DIAGNOSIS — F310 Bipolar disorder, current episode hypomanic: Secondary | ICD-10-CM | POA: Diagnosis not present

## 2024-02-23 DIAGNOSIS — F200 Paranoid schizophrenia: Secondary | ICD-10-CM | POA: Diagnosis not present

## 2024-02-23 NOTE — Progress Notes (Signed)
 Specialty Pharmacy Refill Coordination Note  Rebecca Atkins is a 58 y.o. female contacted today regarding refills of specialty medication(s) ARIPiprazole (ABILIFY MAINTENA)   Patient requested Courier to Provider Office   Delivery date: 03/01/24   Verified address: [address] ([address])   Medication will be filled on 4/8.   Appointment 4/17

## 2024-02-23 NOTE — Progress Notes (Cosign Needed)
 Patient arrived for injection of Abilify Maintena 300mg . It was suppose to be delivered but was not in her bin. Called her pharmacy and they state that it will be delivered by Friday and unsure what happened. Borrowed a 400mg  kit from [facility] and will replace with her medication once delivered. Medication prepared as ordered, had to waste 100mg  witnessed by Alphonzo Cruise, CMA. Given in Left Deltoid without issue or complaint. Denies SI/HI or AV hallucinations. Pleasant and cooperative will return in 28 days. Lot #[id number] Exp: Dec 2026

## 2024-02-23 NOTE — Progress Notes (Signed)
 BH MD/PA/NP OP Progress Note  02/23/2024 3:19 PM KEILI HASTEN  MRN:  [id number]  Chief Complaint: Injection clinic  HPI: Rebecca Atkins 58 year old female presents for long-acting injectable medication. Currently she is prescribed Abilify maintainer 300 mg every 28 days.  She carries a diagnosis related to schizophrenia paranoia and bipolar disorder.  She reports experiencing right hand/wrist tremor when she wakes up in the morning.  States that his tremor happens occasionally.  Patient is not attributing side effects to medication.  However, is requesting to be taking off of her medication and/or reduced.    Patient had a recent adjustment from Abilify 400 mg to 300 mg every 28 days.   She reports taking and tolerating well.  She denied concerns related to suicidal or homicidal ideations.  Denies auditory or visual hallucinations.  States she has been taking her medications as directed.  Denied concerns with appetite or sleep disturbance. "  No issues, I am fine."  -Continue with reduced dose of Abilify 300 mg every 28 days.   During evaluation Yenty N Freehling is sitting she is alert/oriented x 3; calm/cooperative; and mood congruent with affect.  Patient is speaking in a clear tone at moderate volume, and normal pace; with good eye contact.  Her thought process is coherent and relevant; There is no indication that she is currently responding to internal/external stimuli or experiencing delusional thought content.    Patient denies suicidal/self-harm/homicidal ideation, psychosis, and paranoia.  Patient has remained calm throughout assessment and has answered questions appropriately  Visit Diagnosis:    ICD-10-CM   1. Bipolar affective disorder, current episode hypomanic (HCC)  F31.0       Past Psychiatric History:   Past Medical History:  Past Medical History:  Diagnosis Date   Bipolar disorder with severe mania (HCC)    Diabetes mellitus without complication (HCC)    Hyperlipidemia     Hypertension    Noncompliance     Past Surgical History:  Procedure Laterality Date   NO PAST SURGERIES      Family Psychiatric History:   Family History:  Family History  Problem Relation Age of Onset   Diabetes Mother    Hypertension Mother    Diabetes Father    Hypertension Father     Social History:  Social History   Socioeconomic History   Marital status: Single    Spouse name: Not on file   Number of children: Not on file   Years of education: Not on file   Highest education level: Not on file  Occupational History   Not on file  Tobacco Use   Smoking status: Never   Smokeless tobacco: Never  Substance and Sexual Activity   Alcohol use: Not Currently   Drug use: Not Currently   Sexual activity: Not Currently  Other Topics Concern   Not on file  Social History Narrative   Not on file   Social Drivers of Health   Financial Resource Strain: Not on file  Food Insecurity: Not on file  Transportation Needs: Not on file  Physical Activity: Not on file  Stress: Not on file  Social Connections: Unknown (07/15/2023)   Received from [facility]   Social Network    Social Network: Not on file    Allergies:  Allergies  Allergen Reactions   Ibuprofen Rash    Metabolic Disorder Labs: Lab Results  Component Value Date   HGBA1C 14.2 (A) 02/11/2022   No results found for: "PROLACTIN" Lab Results  Component Value Date   CHOL 278 (H) 02/11/2022   TRIG 113 02/11/2022   HDL 80 02/11/2022   CHOLHDL 3.5 02/11/2022   LDLCALC 179 (H) 02/11/2022   LDLCALC 93 06/03/2021   No results found for: "TSH"  Therapeutic Level Labs: No results found for: "LITHIUM" No results found for: "VALPROATE" No results found for: "CBMZ"  Current Medications: Current Outpatient Medications  Medication Sig Dispense Refill   amLODipine (NORVASC) 10 MG tablet Take 1 tablet (10 mg total) by mouth daily. 90 tablet 1   ARIPiprazole ER (ABILIFY MAINTENA) 300 MG PRSY prefilled  syringe Inject 300 mg into the muscle every 28 (twenty-eight) days. 1 each 11   ASPIRIN 81 PO Take by mouth.      atorvastatin (LIPITOR) 40 MG tablet TAKE 1 TABLET (40 MG TOTAL) BY MOUTH DAILY. 90 tablet 3   Blood Glucose Monitoring Suppl (TRUE METRIX METER) w/Device KIT Use as instructed. Check blood glucose level by fingerstick three times per day. 1 kit 0   dapagliflozin propanediol (FARXIGA) 10 MG TABS tablet Take 10 mg by mouth daily.     insulin glargine (LANTUS SOLOSTAR) 100 UNIT/ML Solostar Pen INJECT 10 UNITS INTO THE SKIN DAILY. 3 mL 2   lisinopril (ZESTRIL) 40 MG tablet Take 1 tablet (40 mg total) by mouth daily. 90 tablet 2   metFORMIN (GLUCOPHAGE) 500 MG tablet Take 2 tablets (1,000 mg total) by mouth 2 (two) times daily with a meal. 360 tablet 1   omeprazole (PRILOSEC) 40 MG capsule Take 40 mg by mouth daily.     OZEMPIC, 0.25 OR 0.5 MG/DOSE, 2 MG/3ML SOPN Inject 0.5 mg into the skin once a week.     Vitamin D, Ergocalciferol, (DRISDOL) 1.25 MG (50000 UNIT) CAPS capsule Take 50,000 Units by mouth once a week.     Current Facility-Administered Medications  Medication Dose Route Frequency Provider Last Rate Last Admin   ARIPiprazole ER (ABILIFY MAINTENA) injection 300 mg  300 mg Intramuscular Q28 days    300 mg at 02/23/24 1430     Musculoskeletal: Strength & Muscle Tone: within normal limits Gait & Station: normal Patient leans: N/A  Psychiatric Specialty Exam: Review of Systems  There were no vitals taken for this visit.There is no height or weight on file to calculate BMI.  General Appearance: Casual  Eye Contact:  Good  Speech:  Clear and Coherent  Volume:  Normal  Mood:  Anxious and Depressed  Affect:  Congruent  Thought Process:  Coherent  Orientation:  Full (Time, Place, and Person)  Thought Content: Logical   Suicidal Thoughts:  No  Homicidal Thoughts:  No  Memory:  Immediate;   Good Recent;   Good  Judgement:  Good  Insight:  Fair  Psychomotor Activity:    reported hand tremor/ twitching"    Concentration:  Concentration: Fair  Recall:  Fair  Fund of Knowledge: Good  Language: Good  Akathisia:  No  Handed:  Right  AIMS (if indicated): done  Assets:  Communication Skills Desire for Improvement  ADL's:  Intact  Cognition: WNL  Sleep:  Fair   Screenings: AIMS    Flowsheet Row Clinical Support from 12/23/2023 in [facility] Clinical Support from 05/31/2023 in [facility]  AIMS Total Score 0 0      GAD-7    Flowsheet Row Clinical Support from 12/23/2023 in [facility] Office Visit from 09/30/2023 in [facility] Clinical Support  from 05/31/2023 in [facility] Video Visit from 04/21/2023 in [facility] Office Visit from 04/06/2023 in [facility]  Total GAD-7 Score 0 0 0 0 0      PHQ2-9    Flowsheet Row Clinical Support from 12/23/2023 in [facility] Office Visit from 09/30/2023 in [facility] Clinical Support from 05/31/2023 in [facility] Video Visit from 04/21/2023 in [facility] Office Visit from 04/06/2023 in [facility]  PHQ-2 Total Score 0 0 0 0 0  PHQ-9 Total Score 0 0 0 0 0        Assessment and Plan:  Keep follow-up appointment 28 days for medication management -Continue Abilify Maintena  300 mg reduced dose- (dose decreased from 400 mg to 300 mg Continue to monitor symptoms.  Collaboration of Care: Collaboration of Care: Medication Management AEB Continue Abilify 300 mg every 28 days  Patient/Guardian was advised Release of Information must be obtained prior to any record release in order to collaborate their care with an outside provider. Patient/Guardian was advised if they have not already  done so to contact the registration department to sign all necessary forms in order for [place] to release information regarding their care.   Consent: Patient/Guardian gives verbal consent for treatment and assignment of benefits for services provided during this visit. Patient/Guardian expressed understanding and agreed to proceed.    Oneta Rack, NP 02/23/2024, 3:19 PM

## 2024-03-09 ENCOUNTER — Ambulatory Visit (INDEPENDENT_AMBULATORY_CARE_PROVIDER_SITE_OTHER): Payer: MEDICAID | Admitting: Psychiatry

## 2024-03-09 ENCOUNTER — Encounter (HOSPITAL_COMMUNITY): Payer: Self-pay | Admitting: Psychiatry

## 2024-03-09 VITALS — BP 117/77 | HR 101 | Wt 169.0 lb

## 2024-03-09 DIAGNOSIS — F310 Bipolar disorder, current episode hypomanic: Secondary | ICD-10-CM

## 2024-03-09 NOTE — Progress Notes (Signed)
 BH MD/PA/NP OP Progress Note    03/09/2024 12:55 PM Rebecca Atkins  MRN:  [id number]  Chief Complaint: "I have left hand twitching"  HPI: 58 year old female seen today for follow up psychiatric evaluation.  She has a psychiatric history of schizophrenia and bipolar 1 disorder.  She is currently managed on Abilify Maintena 300 mg monthly. She notes that her medications are effective in managing her psychiatric  conditions but notes that she wishes to discontinue her LAI.     Today she was well-groomed, pleasant, cooperative, and engaged in conversation.  Patient reports that she has left hand twitching. She reports that it only occurs in the morning. Provider asked patient if she could be laying on her hand at night. She denies it.  She also notes that her head feels strange. She describes crackling sensations and notes that she feels that she has a concussion. She denies resent head trauma or falls.  Provider conducted an AIMS assessment and patient scored a 0. Patient was seen in November 2024 by neurology for the similar symptoms and recommendations included monitoring. She had a CT in 2007 which was negative for abnormal findings. Patient also notes that she has been experiencing , dry mouth, dizziness, and occasional constipation. Patient Abilify does has been lowered recently.  Patient notes that she feels that the above symptoms are due to the Abilify and notes that she wants to discontinue it. Provider recommenced oral Abilify. Patient however does not want to trial oral Abilify or other antipsychotic to help mange her mental health.     She informed Clinical research associate that her mood is stable and denies symptoms of anxiety and depression.  Today provider conducted a GAD-7 and patient scored a 0, at her last visit she scored a 0. Provider also conducted PHQ-9 and patient scored a 4, at her last visit she scored a 0.  She endorses adequate appetite. Patient does note that she has been sleeping more than she  usually does  (10 hours).  Today she denies SI/HI/AVH, mania, or paranoia.  At this time Abilify discontinued. Patient will follow up in 2 months for further assessment.  No other concerns noted at this time. Visit Diagnosis:    ICD-10-CM   1. Bipolar affective disorder, current episode hypomanic (HCC)  F31.0          Past Psychiatric History: Bipolar disorder     Past Medical History:  Past Medical History:  Diagnosis Date   Bipolar disorder with severe mania (HCC)    Diabetes mellitus without complication (HCC)    Hyperlipidemia    Hypertension    Noncompliance     Past Surgical History:  Procedure Laterality Date   NO PAST SURGERIES      Family Psychiatric History: Denies  Family History:  Family History  Problem Relation Age of Onset   Diabetes Mother    Hypertension Mother    Diabetes Father    Hypertension Father     Social History:  Social History   Socioeconomic History   Marital status: Single    Spouse name: Not on file   Number of children: Not on file   Years of education: Not on file   Highest education level: Not on file  Occupational History   Not on file  Tobacco Use   Smoking status: Never   Smokeless tobacco: Never  Substance and Sexual Activity   Alcohol use: Not Currently   Drug use: Not Currently   Sexual activity: Not Currently  Other Topics Concern   Not on file  Social History Narrative   Not on file   Social Drivers of Health   Financial Resource Strain: Not on file  Food Insecurity: Not on file  Transportation Needs: Not on file  Physical Activity: Not on file  Stress: Not on file  Social Connections: Unknown (07/15/2023)   [facility] from [facility]   Social Network    Social Network: Not on file    Allergies:  Allergies  Allergen Reactions   Ibuprofen Rash    Metabolic Disorder Labs: Lab Results  Component Value Date   HGBA1C 14.2 (A) 02/11/2022   No results found for: "PROLACTIN" Lab Results   Component Value Date   CHOL 278 (H) 02/11/2022   TRIG 113 02/11/2022   HDL 80 02/11/2022   CHOLHDL 3.5 02/11/2022   LDLCALC 179 (H) 02/11/2022   LDLCALC 93 06/03/2021   No results found for: "TSH"  Therapeutic Level Labs: No results found for: "LITHIUM" No results found for: "VALPROATE" No results found for: "CBMZ"  Current Medications: Current Outpatient Medications  Medication Sig Dispense Refill   amLODipine (NORVASC) 10 MG tablet Take 1 tablet (10 mg total) by mouth daily. 90 tablet 1   ASPIRIN 81 PO Take by mouth.      atorvastatin (LIPITOR) 40 MG tablet TAKE 1 TABLET (40 MG TOTAL) BY MOUTH DAILY. 90 tablet 3   Blood Glucose Monitoring Suppl (TRUE METRIX METER) w/Device KIT Use as instructed. Check blood glucose level by fingerstick three times per day. 1 kit 0   dapagliflozin propanediol (FARXIGA) 10 MG TABS tablet Take 10 mg by mouth daily.     insulin glargine (LANTUS SOLOSTAR) 100 UNIT/ML Solostar Pen INJECT 10 UNITS INTO THE SKIN DAILY. 3 mL 2   lisinopril (ZESTRIL) 40 MG tablet Take 1 tablet (40 mg total) by mouth daily. 90 tablet 2   metFORMIN (GLUCOPHAGE) 500 MG tablet Take 2 tablets (1,000 mg total) by mouth 2 (two) times daily with a meal. 360 tablet 1   omeprazole (PRILOSEC) 40 MG capsule Take 40 mg by mouth daily.     OZEMPIC, 0.25 OR 0.5 MG/DOSE, 2 MG/3ML SOPN Inject 0.5 mg into the skin once a week.     Vitamin D, Ergocalciferol, (DRISDOL) 1.25 MG (50000 UNIT) CAPS capsule Take 50,000 Units by mouth once a week.     No current facility-administered medications for this visit.     Musculoskeletal: Strength & Muscle Tone: within normal limits Gait & Station: normal Patient leans: N/A  Psychiatric Specialty Exam: Review of Systems  Blood pressure 117/77, pulse (!) 101, weight 169 lb (76.7 kg), SpO2 100%.Body mass index is 28.12 kg/m.  General Appearance: Well Groomed  Eye Contact:  Good  Speech:  Clear and Coherent and Normal Rate  Volume:  Normal   Mood:   Euthymic  Affect:  Appropriate and Congruent  Thought Process:  Coherent, Goal Directed, and Linear  Orientation:  Full (Time, Place, and Person)  Thought Content: WDL and Logical   Suicidal Thoughts:  No  Homicidal Thoughts:  No  Memory:  Immediate;   Good Recent;   Good Remote;   Good  Judgement:  Good  Insight:  Good  Psychomotor Activity:  Normal  Concentration:  Concentration: Good and Attention Span: Good  Recall:  Good  Fund of Knowledge: Good  Language: Good  Akathisia:  No  Handed:  Right  AIMS (if indicated): done, 0  Assets:  Communication Skills Desire for Improvement  Financial Resources/Insurance Housing Physical Health Social Support  ADL's:  Intact  Cognition: WNL  Sleep:  Good   Screenings: AIMS    Flowsheet Row Clinical Support from 03/09/2024 in [facility] Clinical Support from 12/23/2023 in [facility] Clinical Support from 05/31/2023 in [facility]  AIMS Total Score 0 0 0      GAD-7    Flowsheet Row Clinical Support from 03/09/2024 in [facility] Clinical Support from 12/23/2023 in [facility] Office Visit from 09/30/2023 in [facility] Clinical Support from 05/31/2023 in [facility] Video Visit from 04/21/2023 in [facility]  Total GAD-7 Score 0 0 0 0 0      PHQ2-9    Flowsheet Row Clinical Support from 03/09/2024 in [facility] Clinical Support from 12/23/2023 in [facility] Office Visit from 09/30/2023 in [facility] Clinical Support from 05/31/2023 in [facility] Video Visit from 04/21/2023 in [facility]  PHQ-2 Total Score 0 0 0 0 0  PHQ-9 Total Score 4 0 0 0 0         Assessment and Plan: Patient informed writer that her mood is more stable.  She denies symptoms of anxiety, depression, or psychosis.  She requested that her medication be discontinued as she believes its causing her to have hand twitching, crackling in head, dry mouth, dizziness, and occasional constipation. Patient does not wish to start oral Abilify or other antipsychotic to help manage mood. At this time Abilify discontinued. Patient will follow up in 2 months for further assessment.   1. Bipolar affective disorder, current episode hypomanic (HCC) (Primary)      Collaboration of Care: Collaboration of Care: Other provider involved in patient's care AEB PCP and shot clinic staff  Patient/Guardian was advised Release of Information must be obtained prior to any record release in order to collaborate their care with an outside provider. Patient/Guardian was advised if they have not already done so to contact the registration department to sign all necessary forms in order for us  to release information regarding their care.   Bipolar affective disorder, current episode hypomanic (HCC)  Reduced- ARIPiprazole ER (ABILIFY MAINTENA) injection 300 mg Reduced- ARIPiprazole ER (ABILIFY MAINTENA) 300 MG PRSY prefilled syringe; Inject 300 mg into the muscle every 28 (twenty-eight) days.  Dispense: 1 each; Refill: 11  Consent: Patient/Guardian gives verbal consent for treatment and assignment of benefits for services provided during this visit. Patient/Guardian expressed understanding and agreed to proceed.   Follow up in 3 months Follow up with shot clinic Arlyne Bering, NP 03/09/2024, 12:55 PM  Unable to

## 2024-03-14 ENCOUNTER — Other Ambulatory Visit: Payer: Self-pay

## 2024-03-14 NOTE — Progress Notes (Signed)
 Per chart, patient does not wish to continue therapy. MD has discontinued therapy in [facility]. Disenrolling.

## 2024-03-23 ENCOUNTER — Ambulatory Visit (HOSPITAL_COMMUNITY): Payer: MEDICAID

## 2024-05-04 ENCOUNTER — Ambulatory Visit (HOSPITAL_COMMUNITY): Payer: MEDICAID | Admitting: Psychiatry

## 2024-05-04 ENCOUNTER — Encounter (HOSPITAL_COMMUNITY): Payer: Self-pay | Admitting: Psychiatry

## 2024-05-04 VITALS — BP 159/82 | HR 76 | Temp 98.4°F | Wt 165.6 lb

## 2024-05-04 DIAGNOSIS — F3170 Bipolar disorder, currently in remission, most recent episode unspecified: Secondary | ICD-10-CM

## 2024-05-04 NOTE — Progress Notes (Signed)
 BH MD/PA/NP OP Progress Note    05/04/2024 2:15 PM Rebecca Atkins  MRN:  [id number]  Chief Complaint: I am doing good  HPI: 58 year old female seen today for follow up psychiatric evaluation.  She has a psychiatric history of schizophrenia and bipolar 1 disorder.  At her last visit patient requested to discontinue  Abilify  Maintena 300 mg monthly.  She reported that she was having side effects.  Patient was not interested in oral Abilify  or other medications.     Today she was well-groomed, pleasant, cooperative, and engaged in conversation.  Patient reports that since discontinuing Abilify  she has been doing well.  She notes that she stays busy by completing her routines in her home.  She notes that she cleans, checks her phone, and her emails.  Since discontinuing Abilify  she also informed writer that she no longer feels dizzy, experienced dry mouth, constipation, hand twitching, and reports that crackling sensations in her head has subsided.    Today she reports that her mood continues to be stable and denies symptoms of anxiety and depression.  Today provider conducted a GAD-7 and patient scored a 0, at her last visit she scored a 0. Provider also conducted PHQ-9 and patient scored a 0, at her last visit she scored a 4.  She endorses adequate appetite.  She endorses adequate sleep and appetite.  Today she denies SI/HI/AVH, mania, or paranoia.  At this time patient reports that she does not wish to restart Abilify  or any other medications.  She does note that she will follow-up in 3 months for further assessment and evaluation.   No other concerns noted at this time. Visit Diagnosis:    ICD-10-CM   1. Bipolar disorder, currently in remission (HCC)  F31.70           Past Psychiatric History: Bipolar disorder     Past Medical History:  Past Medical History:  Diagnosis Date   Bipolar disorder with severe mania (HCC)    Diabetes mellitus without complication (HCC)    Hyperlipidemia     Hypertension    Noncompliance     Past Surgical History:  Procedure Laterality Date   NO PAST SURGERIES      Family Psychiatric History: Denies  Family History:  Family History  Problem Relation Age of Onset   Diabetes Mother    Hypertension Mother    Diabetes Father    Hypertension Father     Social History:  Social History   Socioeconomic History   Marital status: Single    Spouse name: Not on file   Number of children: Not on file   Years of education: Not on file   Highest education level: Not on file  Occupational History   Not on file  Tobacco Use   Smoking status: Never   Smokeless tobacco: Never  Substance and Sexual Activity   Alcohol use: Not Currently   Drug use: Not Currently   Sexual activity: Not Currently  Other Topics Concern   Not on file  Social History Narrative   Not on file   Social Drivers of Health   Financial Resource Strain: Not on file  Food Insecurity: Not on file  Transportation Needs: Not on file  Physical Activity: Not on file  Stress: Not on file  Social Connections: Unknown (07/15/2023)   Received from [facility]   Social Network    Social Network: Not on file    Allergies:  Allergies  Allergen Reactions   Ibuprofen Rash  Metabolic Disorder Labs: Lab Results  Component Value Date   HGBA1C 14.2 (A) 02/11/2022   No results found for: PROLACTIN Lab Results  Component Value Date   CHOL 278 (H) 02/11/2022   TRIG 113 02/11/2022   HDL 80 02/11/2022   CHOLHDL 3.5 02/11/2022   LDLCALC 179 (H) 02/11/2022   LDLCALC 93 06/03/2021   No results found for: TSH  Therapeutic Level Labs: No results found for: LITHIUM No results found for: VALPROATE No results found for: CBMZ  Current Medications: Current Outpatient Medications  Medication Sig Dispense Refill   amLODipine  (NORVASC ) 10 MG tablet Take 1 tablet (10 mg total) by mouth daily. 90 tablet 1   ASPIRIN 81 PO Take by mouth.      atorvastatin   (LIPITOR) 40 MG tablet TAKE 1 TABLET (40 MG TOTAL) BY MOUTH DAILY. 90 tablet 3   Blood Glucose Monitoring Suppl (TRUE METRIX METER) w/Device KIT Use as instructed. Check blood glucose level by fingerstick three times per day. 1 kit 0   dapagliflozin propanediol (FARXIGA) 10 MG TABS tablet Take 10 mg by mouth daily.     insulin  glargine (LANTUS  SOLOSTAR) 100 UNIT/ML Solostar Pen INJECT 10 UNITS INTO THE SKIN DAILY. 3 mL 2   lisinopril  (ZESTRIL ) 40 MG tablet Take 1 tablet (40 mg total) by mouth daily. 90 tablet 2   metFORMIN  (GLUCOPHAGE ) 500 MG tablet Take 2 tablets (1,000 mg total) by mouth 2 (two) times daily with a meal. 360 tablet 1   omeprazole (PRILOSEC) 40 MG capsule Take 40 mg by mouth daily.     OZEMPIC, 0.25 OR 0.5 MG/DOSE, 2 MG/3ML SOPN Inject 0.5 mg into the skin once a week.     Vitamin D, Ergocalciferol, (DRISDOL) 1.25 MG (50000 UNIT) CAPS capsule Take 50,000 Units by mouth once a week.     No current facility-administered medications for this visit.     Musculoskeletal: Strength & Muscle Tone: within normal limits Gait & Station: normal Patient leans: N/A  Psychiatric Specialty Exam: Review of Systems  There were no vitals taken for this visit.There is no height or weight on file to calculate BMI.  General Appearance: Well Groomed  Eye Contact:  Good  Speech:  Clear and Coherent and Normal Rate  Volume:  Normal  Mood:   Euthymic  Affect:  Appropriate and Congruent  Thought Process:  Coherent, Goal Directed, and Linear  Orientation:  Full (Time, Place, and Person)  Thought Content: WDL and Logical   Suicidal Thoughts:  No  Homicidal Thoughts:  No  Memory:  Immediate;   Good Recent;   Good Remote;   Good  Judgement:  Good  Insight:  Good  Psychomotor Activity:  Normal  Concentration:  Concentration: Good and Attention Span: Good  Recall:  Good  Fund of Knowledge: Good  Language: Good  Akathisia:  No  Handed:  Right  AIMS (if indicated): done, 0  Assets:   Communication Skills Desire for Improvement Financial Resources/Insurance Housing Physical Health Social Support  ADL's:  Intact  Cognition: WNL  Sleep:  Good   Screenings: AIMS    Flowsheet Row Clinical Support from 03/09/2024 in [facility] Clinical Support from 12/23/2023 in [facility] Clinical Support from 05/31/2023 in [facility]  AIMS Total Score 0 0 0   GAD-7    Flowsheet Row Clinical Support from 05/04/2024 in [facility] Clinical Support from 03/09/2024 in [facility] Clinical  Support from 12/23/2023 in [facility] Office Visit from 09/30/2023 in [facility] Clinical Support from 05/31/2023 in [facility]  Total GAD-7 Score 0 0 0 0 0   PHQ2-9    Flowsheet Row Clinical Support from 05/04/2024 in [facility] Clinical Support from 03/09/2024 in [facility] Clinical Support from 12/23/2023 in [facility] Office Visit from 09/30/2023 in [facility] Clinical Support from 05/31/2023 in [facility]  PHQ-2 Total Score 0 0 0 0 0  PHQ-9 Total Score 0 4 0 0 0     Assessment and Plan: Patient informed writer that her mood is more stable.  She denies symptoms of anxiety, depression, or psychosis.  At this time patient reports that she does not wish to restart Abilify  or any other medications.  She does note that she will follow-up in 3 months for further assessment and evaluation.  1. Bipolar disorder, currently in remission (HCC) (Primary)        Collaboration of Care: Collaboration of Care: Other provider involved in patient's care AEB PCP and shot clinic staff  Patient/Guardian was advised Release of Information must be  obtained prior to any record release in order to collaborate their care with an outside provider. Patient/Guardian was advised if they have not already done so to contact the registration department to sign all necessary forms in order for us  to release information regarding their care.     Consent: Patient/Guardian gives verbal consent for treatment and assignment of benefits for services provided during this visit. Patient/Guardian expressed understanding and agreed to proceed.   Follow up in 3 months  Arlyne Bering, NP 05/04/2024, 2:15 PM  Unable to

## 2024-07-25 ENCOUNTER — Encounter (HOSPITAL_COMMUNITY): Payer: MEDICAID | Admitting: Physician Assistant

## 2024-08-22 ENCOUNTER — Encounter (HOSPITAL_COMMUNITY): Payer: MEDICAID | Admitting: Psychiatry

## 2024-08-22 ENCOUNTER — Encounter (HOSPITAL_COMMUNITY): Payer: MEDICAID | Admitting: Physician Assistant

## 2024-08-24 ENCOUNTER — Ambulatory Visit (INDEPENDENT_AMBULATORY_CARE_PROVIDER_SITE_OTHER): Payer: MEDICAID | Admitting: Psychiatry

## 2024-08-24 ENCOUNTER — Encounter (HOSPITAL_COMMUNITY): Payer: Self-pay | Admitting: Psychiatry

## 2024-08-24 VITALS — BP 189/83 | HR 73 | Temp 97.4°F | Wt 197.4 lb

## 2024-08-24 DIAGNOSIS — F319 Bipolar disorder, unspecified: Secondary | ICD-10-CM | POA: Diagnosis not present

## 2024-08-24 MED ORDER — ARIPIPRAZOLE 5 MG PO TABS
5.0000 mg | ORAL_TABLET | Freq: Every day | ORAL | 1 refills | Status: DC
Start: 2024-08-24 — End: 2024-09-21

## 2024-08-24 MED ORDER — ABILIFY MAINTENA 300 MG IM PRSY: 300.0000 mg | PREFILLED_SYRINGE | INTRAMUSCULAR | 11 refills | Status: DC

## 2024-08-24 MED ORDER — ARIPIPRAZOLE ER 300 MG IM SRER
300.0000 mg | Freq: Once | INTRAMUSCULAR | Status: AC
  Administered 2024-09-21: 300 mg via INTRAMUSCULAR

## 2024-08-24 NOTE — Progress Notes (Signed)
 BH MD/PA/NP OP Progress Note    08/24/2024 2:16 PM Rebecca Atkins  MRN:  [id number]  Chief Complaint: I want to restart the injection  HPI: 58 year old female seen today for follow up psychiatric evaluation.  She has a psychiatric history of schizophrenia and bipolar 1 disorder.  Currently she is not managed on medications.  In April 2025 she requested to discontinue  Abilify  Maintena 300 mg monthly noting she had side effects.  Today she reports that she wants to restart her LAI.  Today she was well-groomed, pleasant, cooperative, and engaged in conversation.  Patient reports that her family member wants her to restart her LAI.  She reports that they are fearful that she would have a mental decline.  Provider asked patient if she was experiencing symptoms of psychosis and she denies any VAH.  Patient is not irritable, distracted, and she denies having racing thoughts.  She does note that at times she to fears that she will have a mental decline however notes that she has been doing well and is confident that she will continue doing well.  Provider recommended starting an oral pill prior to restarting Abilify .  She was agreeable to this.    Provider asked patient about side effects she complained of such as crackling, hand pains, and dizziness when she last took Abilify .  Patient informed Clinical research associate that she believes Abilify  caused some hand stiffening but notes that she does not feel that her crackling sensation in her head or dizziness came from Abilify  would like to restart it and stay on it for at least a year.    Patient denies symptoms of anxiety and depression.  Today provider conducted a GAD-7 and patient scored a 0.  Provider also conducted PHQ-9 and patient scored a 0.  She endorses adequate sleep and appetite.  Patient does note that she is on medications for hypertension and diabetes.  She reports she has gained 20 pounds in the last 3 months.  Today Abilify  5 mg ordered and sent to preferred  pharmacy.  If tolerated patient will return in a month to receive her Abilify  Maintena 300 mg LAI injection.  Potential side effects of medication and risks vs benefits of treatment vs non-treatment were explained and discussed. All questions were answered. No other concerns noted at this time.    Visit Diagnosis:    ICD-10-CM   1. Bipolar affective disorder, remission status unspecified (HCC)  F31.9 ARIPiprazole  (ABILIFY ) 5 MG tablet    ARIPiprazole  ER (ABILIFY  MAINTENA) injection 300 mg    ARIPiprazole  ER (ABILIFY  MAINTENA) 300 MG PRSY prefilled syringe           Past Psychiatric History: Bipolar disorder     Past Medical History:  Past Medical History:  Diagnosis Date   Bipolar disorder with severe mania (HCC)    Diabetes mellitus without complication (HCC)    Hyperlipidemia    Hypertension    Noncompliance     Past Surgical History:  Procedure Laterality Date   NO PAST SURGERIES      Family Psychiatric History: Denies  Family History:  Family History  Problem Relation Age of Onset   Diabetes Mother    Hypertension Mother    Diabetes Father    Hypertension Father     Social History:  Social History   Socioeconomic History   Marital status: Single    Spouse name: Not on file   Number of children: Not on file   Years of education: Not on file  Highest education level: Not on file  Occupational History   Not on file  Tobacco Use   Smoking status: Never   Smokeless tobacco: Never  Substance and Sexual Activity   Alcohol use: Not Currently   Drug use: Not Currently   Sexual activity: Not Currently  Other Topics Concern   Not on file  Social History Narrative   Not on file   Social Drivers of Health   Financial Resource Strain: Not on file  Food Insecurity: Not on file  Transportation Needs: Not on file  Physical Activity: Not on file  Stress: Not on file  Social Connections: Unknown (07/15/2023)   Received from [facility]   Social Network     Social Network: Not on file    Allergies:  Allergies  Allergen Reactions   Ibuprofen Rash    Metabolic Disorder Labs: Lab Results  Component Value Date   HGBA1C 14.2 (A) 02/11/2022   No results found for: PROLACTIN Lab Results  Component Value Date   CHOL 278 (H) 02/11/2022   TRIG 113 02/11/2022   HDL 80 02/11/2022   CHOLHDL 3.5 02/11/2022   LDLCALC 179 (H) 02/11/2022   LDLCALC 93 06/03/2021   No results found for: TSH  Therapeutic Level Labs: No results found for: LITHIUM No results found for: VALPROATE No results found for: CBMZ  Current Medications: Current Outpatient Medications  Medication Sig Dispense Refill   ARIPiprazole  (ABILIFY ) 5 MG tablet Take 1 tablet (5 mg total) by mouth daily. 30 tablet 1   ARIPiprazole  ER (ABILIFY  MAINTENA) 300 MG PRSY prefilled syringe Inject 300 mg into the muscle every 28 (twenty-eight) days. 1 each 11   amLODipine  (NORVASC ) 10 MG tablet Take 1 tablet (10 mg total) by mouth daily. 90 tablet 1   ASPIRIN 81 PO Take by mouth.      atorvastatin  (LIPITOR) 40 MG tablet TAKE 1 TABLET (40 MG TOTAL) BY MOUTH DAILY. 90 tablet 3   Blood Glucose Monitoring Suppl (TRUE METRIX METER) w/Device KIT Use as instructed. Check blood glucose level by fingerstick three times per day. 1 kit 0   dapagliflozin propanediol (FARXIGA) 10 MG TABS tablet Take 10 mg by mouth daily.     insulin  glargine (LANTUS  SOLOSTAR) 100 UNIT/ML Solostar Pen INJECT 10 UNITS INTO THE SKIN DAILY. 3 mL 2   lisinopril  (ZESTRIL ) 40 MG tablet Take 1 tablet (40 mg total) by mouth daily. 90 tablet 2   metFORMIN  (GLUCOPHAGE ) 500 MG tablet Take 2 tablets (1,000 mg total) by mouth 2 (two) times daily with a meal. 360 tablet 1   omeprazole (PRILOSEC) 40 MG capsule Take 40 mg by mouth daily.     OZEMPIC, 0.25 OR 0.5 MG/DOSE, 2 MG/3ML SOPN Inject 0.5 mg into the skin once a week.     Vitamin D, Ergocalciferol, (DRISDOL) 1.25 MG (50000 UNIT) CAPS capsule Take 50,000 Units by  mouth once a week.     Current Facility-Administered Medications  Medication Dose Route Frequency Provider Last Rate Last Admin   ARIPiprazole  ER (ABILIFY  MAINTENA) injection 300 mg  300 mg Intramuscular Once          Musculoskeletal: Strength & Muscle Tone: within normal limits Gait & Station: normal Patient leans: N/A  Psychiatric Specialty Exam: Review of Systems  Blood pressure (!) 189/83, pulse 73, temperature (!) 97.4 F (36.3 C), weight 197 lb 6.4 oz (89.5 kg), SpO2 100%.Body mass index is 32.85 kg/m.  General Appearance: Well Groomed  Eye Contact:  Good  Speech:  Clear and Coherent and Normal Rate  Volume:  Normal  Mood:   Euthymic  Affect:  Appropriate and Congruent  Thought Process:  Coherent, Goal Directed, and Linear  Orientation:  Full (Time, Place, and Person)  Thought Content: WDL and Logical   Suicidal Thoughts:  No  Homicidal Thoughts:  No  Memory:  Immediate;   Good Recent;   Good Remote;   Good  Judgement:  Good  Insight:  Good  Psychomotor Activity:  Normal  Concentration:  Concentration: Good and Attention Span: Good  Recall:  Good  Fund of Knowledge: Good  Language: Good  Akathisia:  No  Handed:  Right  AIMS (if indicated): not done  Assets:  Communication Skills Desire for Improvement Financial Resources/Insurance Housing Physical Health Social Support  ADL's:  Intact  Cognition: WNL  Sleep:  Good   Screenings: AIMS    Flowsheet Row Clinical Support from 03/09/2024 in [facility] Clinical Support from 12/23/2023 in [facility] Clinical Support from 05/31/2023 in [facility]  AIMS Total Score 0 0 0   GAD-7    Flowsheet Row Clinical Support from 08/24/2024 in [facility] Clinical Support from 05/04/2024 in [facility] Clinical Support from 03/09/2024 in [facility]  Clinical Support from 12/23/2023 in [facility] Office Visit from 09/30/2023 in [facility]  Total GAD-7 Score 0 0 0 0 0   PHQ2-9    Flowsheet Row Clinical Support from 08/24/2024 in [facility] Clinical Support from 05/04/2024 in [facility] Clinical Support from 03/09/2024 in [facility] Clinical Support from 12/23/2023 in [facility] Office Visit from 09/30/2023 in [facility]  PHQ-2 Total Score 0 0 0 0 0  PHQ-9 Total Score 0 0 4 0 0     Assessment and Plan: Patient informed writer that her mood is more stable.  She denies symptoms of anxiety, depression, or psychosis.  She reports that at her family is concerned about mental decline and would like her to restart her medication.  Patient notes that at times she due to fears been to decline and would like to restart her LAI.Today Abilify  5 mg ordered and sent to preferred pharmacy.  If tolerated patient will return in a month to receive her Abilify  Maintena 300 mg LAI injection.  1. Bipolar affective disorder, remission status unspecified (HCC) (Primary)  Restart- ARIPiprazole  (ABILIFY ) 5 MG tablet; Take 1 tablet (5 mg total) by mouth daily.  Dispense: 30 tablet; Refill: 1 Restart- ARIPiprazole  ER (ABILIFY  MAINTENA) injection 300 mg Restart- ARIPiprazole  ER (ABILIFY  MAINTENA) 300 MG PRSY prefilled syringe; Inject 300 mg into the muscle every 28 (twenty-eight) days.  Dispense: 1 each; Refill: 11        Collaboration of Care: Collaboration of Care: Other provider involved in patient's care AEB PCP and shot clinic staff  Patient/Guardian was advised Release of Information must be obtained prior to any record release in order to collaborate their care with an outside provider. Patient/Guardian was advised if they have not already done so to contact  the registration department to sign all necessary forms in order for us  to release information regarding their care.     Consent: Patient/Guardian gives verbal consent for treatment and assignment of benefits for services provided during this visit. Patient/Guardian expressed understanding and agreed to  proceed.   Follow up in 1 months  Zane FORBES Bach, NP 08/24/2024, 2:16 PM  Unable to

## 2024-09-21 ENCOUNTER — Ambulatory Visit (INDEPENDENT_AMBULATORY_CARE_PROVIDER_SITE_OTHER): Payer: MEDICAID

## 2024-09-21 ENCOUNTER — Ambulatory Visit (INDEPENDENT_AMBULATORY_CARE_PROVIDER_SITE_OTHER): Payer: MEDICAID | Admitting: Psychiatry

## 2024-09-21 ENCOUNTER — Encounter (HOSPITAL_COMMUNITY): Payer: Self-pay

## 2024-09-21 ENCOUNTER — Encounter (HOSPITAL_COMMUNITY): Payer: Self-pay | Admitting: Psychiatry

## 2024-09-21 DIAGNOSIS — F200 Paranoid schizophrenia: Secondary | ICD-10-CM | POA: Diagnosis not present

## 2024-09-21 DIAGNOSIS — F319 Bipolar disorder, unspecified: Secondary | ICD-10-CM

## 2024-09-21 MED ORDER — ABILIFY MAINTENA 300 MG IM PRSY: 300.0000 mg | PREFILLED_SYRINGE | INTRAMUSCULAR | 11 refills | Status: DC

## 2024-09-21 NOTE — Progress Notes (Signed)
 Patient presented to the office for Abilify maintenna 300 injection given by Lupita Leash with no complaints . Pt tolerated injection well and will return in 28 days injection was given in the left arm.

## 2024-09-21 NOTE — Progress Notes (Signed)
 BH MD/PA/NP OP Progress Note    09/21/2024 11:35 AM Rebecca Atkins  MRN:  [id number]  Chief Complaint: I have been a little sleepy but I am okay  HPI: 58 year old female seen today for follow up psychiatric evaluation.  She has a psychiatric history of schizophrenia and bipolar 1 disorder.  Currently she managed on Abilify  5 mg daily.  At her last visit she writer that she wanted to restart  Abilify  Maintena 300 mg monthly.   Today she reports that Abilify  has been effective in managing her psychiatric conditions.  Today she was well-groomed, pleasant, cooperative, and engaged in conversation.  Patient reports that has improved since restarting oral Abilify .  She does note that she would like to restart her LAI.  She informed clinical research associate that since starting oral Abilify  she was somewhat sleep that she is able to cope with this.  Today she notes her mood is stable and reports that she has minimal anxiety and depression.  Today provider conducted a GAD-7 and patient scored 0.  Provider also conducted PHQ-9 and patient scored 0.  She endorsed adequate sleep and appetite.  Today she denies SI/HI/VAH, mania, or paranoia.   Provider conducted an aims assessment and patient scored a 0.  Today patient received Abilify  Maintena 300 mg LAI injection.  She will follow-up in 1 month for next injection.  Abilify  5 mg discontinued.  No other concerns noted at this time.    Visit Diagnosis:    ICD-10-CM   1. Bipolar affective disorder, remission status unspecified (HCC)  F31.9 ARIPiprazole  ER (ABILIFY  MAINTENA) 300 MG PRSY prefilled syringe            Past Psychiatric History: Bipolar disorder     Past Medical History:  Past Medical History:  Diagnosis Date   Bipolar disorder with severe mania (HCC)    Diabetes mellitus without complication (HCC)    Hyperlipidemia    Hypertension    Noncompliance     Past Surgical History:  Procedure Laterality Date   NO PAST SURGERIES      Family  Psychiatric History: Denies  Family History:  Family History  Problem Relation Age of Onset   Diabetes Mother    Hypertension Mother    Diabetes Father    Hypertension Father     Social History:  Social History   Socioeconomic History   Marital status: Single    Spouse name: Not on file   Number of children: Not on file   Years of education: Not on file   Highest education level: Not on file  Occupational History   Not on file  Tobacco Use   Smoking status: Never   Smokeless tobacco: Never  Substance and Sexual Activity   Alcohol use: Not Currently   Drug use: Not Currently   Sexual activity: Not Currently  Other Topics Concern   Not on file  Social History Narrative   Not on file   Social Drivers of Health   Financial Resource Strain: Not on file  Food Insecurity: Not on file  Transportation Needs: Not on file  Physical Activity: Not on file  Stress: Not on file  Social Connections: Unknown (07/15/2023)   Received from [facility]   Social Network    Social Network: Not on file    Allergies:  Allergies  Allergen Reactions   Ibuprofen Rash    Metabolic Disorder Labs: Lab Results  Component Value Date   HGBA1C 14.2 (A) 02/11/2022   No results found for:  PROLACTIN Lab Results  Component Value Date   CHOL 278 (H) 02/11/2022   TRIG 113 02/11/2022   HDL 80 02/11/2022   CHOLHDL 3.5 02/11/2022   LDLCALC 179 (H) 02/11/2022   LDLCALC 93 06/03/2021   No results found for: TSH  Therapeutic Level Labs: No results found for: LITHIUM No results found for: VALPROATE No results found for: CBMZ  Current Medications: Current Outpatient Medications  Medication Sig Dispense Refill   amLODipine  (NORVASC ) 10 MG tablet Take 1 tablet (10 mg total) by mouth daily. 90 tablet 1   ARIPiprazole  ER (ABILIFY  MAINTENA) 300 MG PRSY prefilled syringe Inject 300 mg into the muscle every 28 (twenty-eight) days. 1 each 11   ASPIRIN 81 PO Take by mouth.       atorvastatin  (LIPITOR) 40 MG tablet TAKE 1 TABLET (40 MG TOTAL) BY MOUTH DAILY. 90 tablet 3   Blood Glucose Monitoring Suppl (TRUE METRIX METER) w/Device KIT Use as instructed. Check blood glucose level by fingerstick three times per day. 1 kit 0   dapagliflozin propanediol (FARXIGA) 10 MG TABS tablet Take 10 mg by mouth daily.     insulin  glargine (LANTUS  SOLOSTAR) 100 UNIT/ML Solostar Pen INJECT 10 UNITS INTO THE SKIN DAILY. 3 mL 2   lisinopril  (ZESTRIL ) 40 MG tablet Take 1 tablet (40 mg total) by mouth daily. 90 tablet 2   metFORMIN  (GLUCOPHAGE ) 500 MG tablet Take 2 tablets (1,000 mg total) by mouth 2 (two) times daily with a meal. 360 tablet 1   omeprazole (PRILOSEC) 40 MG capsule Take 40 mg by mouth daily.     OZEMPIC, 0.25 OR 0.5 MG/DOSE, 2 MG/3ML SOPN Inject 0.5 mg into the skin once a week.     Vitamin D, Ergocalciferol, (DRISDOL) 1.25 MG (50000 UNIT) CAPS capsule Take 50,000 Units by mouth once a week.     No current facility-administered medications for this visit.     Musculoskeletal: Strength & Muscle Tone: within normal limits Gait & Station: normal Patient leans: N/A  Psychiatric Specialty Exam: Review of Systems  Blood pressure (!) 175/77, pulse 82, temperature 98.2 F (36.8 C), height 5' 5 (1.651 m), weight 199 lb 12.8 oz (90.6 kg), SpO2 98%.Body mass index is 33.25 kg/m.  General Appearance: Well Groomed  Eye Contact:  Good  Speech:  Clear and Coherent and Normal Rate  Volume:  Normal  Mood:   Euthymic  Affect:  Appropriate and Congruent  Thought Process:  Coherent, Goal Directed, and Linear  Orientation:  Full (Time, Place, and Person)  Thought Content: WDL and Logical   Suicidal Thoughts:  No  Homicidal Thoughts:  No  Memory:  Immediate;   Good Recent;   Good Remote;   Good  Judgement:  Good  Insight:  Good  Psychomotor Activity:  Normal  Concentration:  Concentration: Good and Attention Span: Good  Recall:  Good  Fund of Knowledge: Good  Language: Good   Akathisia:  No  Handed:  Right  AIMS (if indicated): done, 0  Assets:  Communication Skills Desire for Improvement Financial Resources/Insurance Housing Physical Health Social Support  ADL's:  Intact  Cognition: WNL  Sleep:  Good   Screenings: AIMS    Flowsheet Row Clinical Support from 03/09/2024 in [facility] Clinical Support from 12/23/2023 in [facility] Clinical Support from 05/31/2023 in [facility]  AIMS Total Score 0 0 0   GAD-7    Flowsheet Row Clinical Support from 08/24/2024 in [facility] Clinical Support from 05/04/2024 in [facility] Clinical Support from 03/09/2024 in [facility] Clinical Support from 12/23/2023 in [facility] Office Visit from 09/30/2023 in [facility]  Total GAD-7 Score 0 0 0 0 0   PHQ2-9    Flowsheet Row Clinical Support from 08/24/2024 in [facility] Clinical Support from 05/04/2024 in [facility] Clinical Support from 03/09/2024 in [facility] Clinical Support from 12/23/2023 in [facility] Office Visit from 09/30/2023 in [facility]  PHQ-2 Total Score 0 0 0 0 0  PHQ-9 Total Score 0 0 4 0 0     Assessment and Plan: Patient informed writer that her mood, anxiety, and depression are stable.  Today patient received Abilify  Maintena 300 mg LAI injection.  She will follow-up in 1 month for next injection.  Abilify  5 mg discontinued.    1. Bipolar affective disorder, remission status unspecified (HCC)  Start - ARIPiprazole  ER (ABILIFY  MAINTENA) 300 MG PRSY prefilled syringe; Inject 300 mg into the muscle every 28 (twenty-eight) days.  Dispense: 1 each; Refill: 11  Collaboration of Care:  Collaboration of Care: Other provider involved in patient's care AEB PCP and shot clinic staff  Patient/Guardian was advised Release of Information must be obtained prior to any record release in order to collaborate their care with an outside provider. Patient/Guardian was advised if they have not already done so to contact the registration department to sign all necessary forms in order for us  to release information regarding their care.     Consent: Patient/Guardian gives verbal consent for treatment and assignment of benefits for services provided during this visit. Patient/Guardian expressed understanding and agreed to proceed.   Follow up in 1 months  Zane FORBES Bach, NP 09/21/2024, 11:35 AM  Unable to

## 2024-10-24 ENCOUNTER — Ambulatory Visit (INDEPENDENT_AMBULATORY_CARE_PROVIDER_SITE_OTHER): Payer: MEDICAID

## 2024-10-24 ENCOUNTER — Encounter (HOSPITAL_COMMUNITY): Payer: Self-pay

## 2024-10-24 VITALS — BP 179/90 | HR 76 | Wt 198.8 lb

## 2024-10-24 DIAGNOSIS — F200 Paranoid schizophrenia: Secondary | ICD-10-CM

## 2024-10-24 DIAGNOSIS — F411 Generalized anxiety disorder: Secondary | ICD-10-CM | POA: Diagnosis not present

## 2024-10-24 MED ORDER — ARIPIPRAZOLE ER 300 MG IM PRSY
300.0000 mg | PREFILLED_SYRINGE | Freq: Once | INTRAMUSCULAR | Status: AC
  Administered 2024-10-24: 300 mg via INTRAMUSCULAR

## 2024-10-24 NOTE — Progress Notes (Signed)
 Patient presented to the office for Abilify maintenna 300 injection given by Lupita Leash with no complaints . Pt tolerated injection well and will return in 28 days injection was given in the left arm.

## 2024-11-22 ENCOUNTER — Encounter (HOSPITAL_COMMUNITY): Payer: Self-pay

## 2024-11-22 ENCOUNTER — Ambulatory Visit (HOSPITAL_COMMUNITY): Payer: MEDICAID

## 2024-11-22 VITALS — BP 174/78 | HR 69 | Temp 97.9°F | Ht 65.0 in | Wt 197.8 lb

## 2024-11-22 DIAGNOSIS — F200 Paranoid schizophrenia: Secondary | ICD-10-CM

## 2024-11-22 DIAGNOSIS — F319 Bipolar disorder, unspecified: Secondary | ICD-10-CM | POA: Diagnosis not present

## 2024-11-22 MED ORDER — ARIPIPRAZOLE ER 300 MG IM PRSY
300.0000 mg | PREFILLED_SYRINGE | INTRAMUSCULAR | Status: AC
  Administered 2024-11-22 – 2024-12-21 (×2): 300 mg via INTRAMUSCULAR

## 2024-11-22 NOTE — Progress Notes (Unsigned)
 Pt presented for Injection of Abilify  300 mg  in LEFT deltoid. Pt tolerated injection well. Pt denies SI/HI/AVH. No additional concerns. Pt will return in 28 days.   NDC 40851-954-19 EXP FEB 2028 LOT [id number]  CJT-CMA Student

## 2024-12-21 ENCOUNTER — Ambulatory Visit (INDEPENDENT_AMBULATORY_CARE_PROVIDER_SITE_OTHER): Payer: MEDICAID

## 2024-12-21 ENCOUNTER — Ambulatory Visit (HOSPITAL_COMMUNITY): Payer: MEDICAID | Admitting: Psychiatry

## 2024-12-21 ENCOUNTER — Encounter (HOSPITAL_COMMUNITY): Payer: Self-pay

## 2024-12-21 ENCOUNTER — Encounter (HOSPITAL_COMMUNITY): Payer: Self-pay | Admitting: Psychiatry

## 2024-12-21 VITALS — BP 164/74 | HR 89 | Temp 97.5°F | Wt 194.0 lb

## 2024-12-21 DIAGNOSIS — F319 Bipolar disorder, unspecified: Secondary | ICD-10-CM | POA: Diagnosis not present

## 2024-12-21 DIAGNOSIS — F200 Paranoid schizophrenia: Secondary | ICD-10-CM | POA: Diagnosis not present

## 2024-12-21 MED ORDER — ABILIFY MAINTENA 300 MG IM PRSY: 300.0000 mg | PREFILLED_SYRINGE | INTRAMUSCULAR | 11 refills | Status: AC

## 2024-12-21 NOTE — Progress Notes (Signed)
 [facility] MD/PA/NP OP Progress Note    12/21/2024 11:58 AM Rebecca Atkins  MRN:  [id number]  Chief Complaint: I am okay  HPI: 59 year old female seen today for follow up psychiatric evaluation.  She has a psychiatric history of schizophrenia and bipolar 1 disorder.  Currently she managed on Abilify  Maintena 300 mg monthly.   Today she reports that Abilify  has been effective in managing her psychiatric conditions.  Today she was well-groomed, pleasant, cooperative, and engaged in conversation.  Patient reports that she has been well.  Her mood is stable and denies symptoms of anxiety and depression.  Today provider conducted a GAD-7 and patient scored 0, at her last visit she scored a 0.  Provider also conducted PHQ-9 and patient scored 0, at her last visit she scored a 0.  She endorsed adequate sleep and appetite.  Today she denies SI/HI/VAH, mania, or paranoia.   Provider conducted an aims assessment and patient scored a 0.  No medication changes made today.  Patient agreeable to continue medication as prescribed.  Patient has not has labs in over 6 months. Today provider ordered CBC, CMP, LFT, Thyroid function, Prolactin level, Lipid panel, and Hgb A1c. Provider also ordered an EKG.  No other concerns noted at this time.    Visit Diagnosis:    ICD-10-CM   1. Bipolar affective disorder, remission status unspecified (HCC)  F31.9 ARIPiprazole  ER (ABILIFY  MAINTENA) 300 MG PRSY prefilled syringe    CBC with Differential/Platelet    Comprehensive Metabolic Panel (CMET)    Hepatic function panel    EKG 12-Lead    Thyroid Panel With TSH    Prolactin    Lipid panel    HgB A1c             Past Psychiatric History: Bipolar disorder     Past Medical History:  Past Medical History:  Diagnosis Date   Bipolar disorder with severe mania (HCC)    Diabetes mellitus without complication (HCC)    Hyperlipidemia    Hypertension    Noncompliance     Past Surgical History:  Procedure Laterality  Date   NO PAST SURGERIES      Family Psychiatric History: Denies  Family History:  Family History  Problem Relation Age of Onset   Diabetes Mother    Hypertension Mother    Diabetes Father    Hypertension Father     Social History:  Social History   Socioeconomic History   Marital status: Single    Spouse name: Not on file   Number of children: Not on file   Years of education: Not on file   Highest education level: Not on file  Occupational History   Not on file  Tobacco Use   Smoking status: Never   Smokeless tobacco: Never  Substance and Sexual Activity   Alcohol use: Not Currently   Drug use: Not Currently   Sexual activity: Not Currently  Other Topics Concern   Not on file  Social History Narrative   Not on file   Social Drivers of Health   Tobacco Use: Low Risk (12/21/2024)   Patient History    Smoking Tobacco Use: Never    Smokeless Tobacco Use: Never    Passive Exposure: Not on file  Financial Resource Strain: Not on file  Food Insecurity: Not on file  Transportation Needs: Not on file  Physical Activity: Not on file  Stress: Not on file  Social Connections: Unknown (07/15/2023)   Received from [facility]  Social Network    Social Network: Not on file  Depression [phone]): Low Risk (12/21/2024)   Depression (PHQ2-9)    PHQ-2 Score: 0  Alcohol Screen: Not on file  Housing: Not on file  Utilities: Not on file  Health Literacy: Not on file    Allergies:  Allergies  Allergen Reactions   Ibuprofen Rash    Metabolic Disorder Labs: Lab Results  Component Value Date   HGBA1C 14.2 (A) 02/11/2022   No results found for: PROLACTIN Lab Results  Component Value Date   CHOL 278 (H) 02/11/2022   TRIG 113 02/11/2022   HDL 80 02/11/2022   CHOLHDL 3.5 02/11/2022   LDLCALC 179 (H) 02/11/2022   LDLCALC 93 06/03/2021   No results found for: TSH  Therapeutic Level Labs: No results found for: LITHIUM No results found for: VALPROATE No  results found for: CBMZ  Current Medications: Current Outpatient Medications  Medication Sig Dispense Refill   amLODipine  (NORVASC ) 10 MG tablet Take 1 tablet (10 mg total) by mouth daily. 90 tablet 1   ARIPiprazole  ER (ABILIFY  MAINTENA) 300 MG PRSY prefilled syringe Inject 300 mg into the muscle every 28 (twenty-eight) days. 1 each 11   ASPIRIN 81 PO Take by mouth.      atorvastatin  (LIPITOR) 40 MG tablet TAKE 1 TABLET (40 MG TOTAL) BY MOUTH DAILY. 90 tablet 3   Blood Glucose Monitoring Suppl (TRUE METRIX METER) w/Device KIT Use as instructed. Check blood glucose level by fingerstick three times per day. 1 kit 0   dapagliflozin propanediol (FARXIGA) 10 MG TABS tablet Take 10 mg by mouth daily.     insulin  glargine (LANTUS  SOLOSTAR) 100 UNIT/ML Solostar Pen INJECT 10 UNITS INTO THE SKIN DAILY. 3 mL 2   lisinopril  (ZESTRIL ) 40 MG tablet Take 1 tablet (40 mg total) by mouth daily. 90 tablet 2   metFORMIN  (GLUCOPHAGE ) 500 MG tablet Take 2 tablets (1,000 mg total) by mouth 2 (two) times daily with a meal. 360 tablet 1   omeprazole (PRILOSEC) 40 MG capsule Take 40 mg by mouth daily.     OZEMPIC, 0.25 OR 0.5 MG/DOSE, 2 MG/3ML SOPN Inject 0.5 mg into the skin once a week.     Vitamin D, Ergocalciferol, (DRISDOL) 1.25 MG (50000 UNIT) CAPS capsule Take 50,000 Units by mouth once a week.     Current Facility-Administered Medications  Medication Dose Route Frequency Provider Last Rate Last Admin   ARIPiprazole  ER (ABILIFY  MAINTENA) 300 MG prefilled syringe 300 mg  300 mg Intramuscular Q28 days Harl Regan E, NP   300 mg at 11/22/24 1019     Musculoskeletal: Strength & Muscle Tone: within normal limits Gait & Station: normal Patient leans: N/A  Psychiatric Specialty Exam: Review of Systems  Blood pressure (!) 164/74, pulse 89, temperature (!) 97.5 F (36.4 C), weight 194 lb (88 kg), SpO2 98%.Body mass index is 32.28 kg/m.  General Appearance: Well Groomed  Eye Contact:  Good  Speech:   Clear and Coherent and Normal Rate  Volume:  Normal  Mood:   Euthymic  Affect:  Appropriate and Congruent  Thought Process:  Coherent, Goal Directed, and Linear  Orientation:  Full (Time, Place, and Person)  Thought Content: WDL and Logical   Suicidal Thoughts:  No  Homicidal Thoughts:  No  Memory:  Immediate;   Good Recent;   Good Remote;   Good  Judgement:  Good  Insight:  Good  Psychomotor Activity:  Normal  Concentration:  Concentration: Good and Attention  Span: Good  Recall:  Good  Fund of Knowledge: Good  Language: Good  Akathisia:  No  Handed:  Right  AIMS (if indicated): done, 0  Assets:  Communication Skills Desire for Improvement Financial Resources/Insurance Housing Physical Health Social Support  ADL's:  Intact  Cognition: WNL  Sleep:  Good   Screenings: AIMS    Flowsheet Row Clinical Support from 09/21/2024 in [facility] Clinical Support from 03/09/2024 in [facility] Clinical Support from 12/23/2023 in [facility] Clinical Support from 05/31/2023 in [facility]  AIMS Total Score 0 0 0 0   GAD-7    Flowsheet Row Clinical Support from 12/21/2024 in [facility] Clinical Support from 09/21/2024 in [facility] Clinical Support from 08/24/2024 in [facility] Clinical Support from 05/04/2024 in [facility] Clinical Support from 03/09/2024 in [facility]  Total GAD-7 Score 0 0 0 0 0   PHQ2-9    Flowsheet Row Clinical Support from 12/21/2024 in [facility] Clinical Support from 09/21/2024 in [facility] Clinical Support from 08/24/2024 in [facility] Clinical Support from 05/04/2024 in [facility] Clinical  Support from 03/09/2024 in [facility]  PHQ-2 Total Score 0 0 0 0 0  PHQ-9 Total Score 0 0 0 0 4     Assessment and Plan: Patient informed writer that her mood, anxiety, and depression are stable.  No medication changes made today.  Patient agreeable to continue medication as prescribed.  Patient has not has labs in over 6 months. Today provider ordered CBC, CMP, LFT, Thyroid function, Prolactin level, Lipid panel, and Hgb A1c. Provider also ordered an EKG.  1. Bipolar affective disorder, remission status unspecified (HCC)  Continue- ARIPiprazole  ER (ABILIFY  MAINTENA) 300 MG PRSY prefilled syringe; Inject 300 mg into the muscle every 28 (twenty-eight) days.  Dispense: 1 each; Refill: 11 - CBC with Differential/Platelet; Future - Comprehensive Metabolic Panel (CMET); Future - Hepatic function panel; Future - EKG 12-Lead; Future - Thyroid Panel With TSH; Future - Prolactin; Future - Lipid panel; Future - HgB A1c; Future    Collaboration of Care: Collaboration of Care: Other provider involved in patient's care AEB PCP and shot clinic staff  Patient/Guardian was advised Release of Information must be obtained prior to any record release in order to collaborate their care with an outside provider. Patient/Guardian was advised if they have not already done so to contact the registration department to sign all necessary forms in order for us  to release information regarding their care.     Consent: Patient/Guardian gives verbal consent for treatment and assignment of benefits for services provided during this visit. Patient/Guardian expressed understanding and agreed to proceed.   Follow up in 1 months with shot clinic Follow up in 2 weeks for labs Follow up in 3 months for medication management.   Zane FORBES Bach, NP 12/21/2024, 11:58 AM

## 2024-12-21 NOTE — Progress Notes (Cosign Needed)
 Patient presented to office for injection of Abilify  Maintena 300 mg. Patient received injection in Left deltoid, per patient request. Patient tolerated injection well. Patient had office visit with provider prior to injection. Patient denies any concerns. Patient will return in 28 days.

## 2025-01-03 ENCOUNTER — Other Ambulatory Visit (HOSPITAL_COMMUNITY): Payer: MEDICAID

## 2025-01-18 ENCOUNTER — Ambulatory Visit (HOSPITAL_COMMUNITY): Payer: MEDICAID
